# Patient Record
Sex: Male | Born: 1985 | Race: Black or African American | Hispanic: No | Marital: Single | Smoking: Never smoker
Health system: Southern US, Community
[De-identification: ages and names within clinical notes are randomized; demographics above are authoritative.]

## PROBLEM LIST (undated history)

## (undated) DIAGNOSIS — F988 Other specified behavioral and emotional disorders with onset usually occurring in childhood and adolescence: Secondary | ICD-10-CM

---

## 2007-09-10 ENCOUNTER — Emergency Department (HOSPITAL_COMMUNITY): Admission: EM | Admit: 2007-09-10 | Discharge: 2007-09-10 | Payer: Self-pay | Admitting: Emergency Medicine

## 2010-12-09 ENCOUNTER — Emergency Department (INDEPENDENT_AMBULATORY_CARE_PROVIDER_SITE_OTHER)
Admission: EM | Admit: 2010-12-09 | Discharge: 2010-12-09 | Disposition: A | Discharge status: Home / Self Care | Payer: Commercial Managed Care - PPO | Source: Home / Self Care

## 2010-12-09 DIAGNOSIS — J111 Influenza due to unidentified influenza virus with other respiratory manifestations: Secondary | ICD-10-CM

## 2010-12-09 MED ORDER — IBUPROFEN 800 MG PO TABS
800.0000 mg | ORAL_TABLET | Freq: Once | ORAL | Status: AC
  Administered 2010-12-09: 800 mg via ORAL

## 2010-12-09 MED ORDER — IBUPROFEN 800 MG PO TABS
ORAL_TABLET | ORAL | Status: AC
  Filled 2010-12-09: qty 1

## 2010-12-09 NOTE — ED Provider Notes (Signed)
History     CSN: [id number] Arrival date & time: 12/09/2010  6:33 PM   None     Chief Complaint  Patient presents with  . Influenza    Pt has headache, cough, fever and congestion    (Consider location/radiation/quality/duration/timing/severity/associated sxs/prior treatment) HPI Comments: Pt developed sx abruptly on 12/6. Fever, chills, sore throat, nasal congestion, cough, body aches. Did not get flu shot this year  Patient is a 25 y.o. male presenting with flu symptoms.  Influenza This is a new problem. The current episode started more than 2 days ago. The problem occurs constantly. The problem has not changed since onset.Pertinent negatives include no shortness of breath. The symptoms are aggravated by nothing. The symptoms are relieved by nothing. He has tried acetaminophen and rest for the symptoms. The treatment provided mild relief.    Past Medical History  Diagnosis Date  . Asthma     History reviewed. No pertinent past surgical history.  History reviewed. No pertinent family history.  History  Substance Use Topics  . Smoking status: Not on file  . Smokeless tobacco: Not on file  . Alcohol Use:       Review of Systems  Constitutional: Positive for fever and chills.  HENT: Positive for congestion, sore throat and rhinorrhea.   Respiratory: Positive for cough. Negative for shortness of breath and wheezing.   Musculoskeletal: Positive for myalgias.    Allergies  Amoxicillin and Ceclor  Home Medications   Current Outpatient Rx  Name Route Sig Dispense Refill  . ACETAMINOPHEN 325 MG PO TABS Oral Take 650 mg by mouth every 6 (six) hours as needed.      . GUAIFENESIN ER 600 MG PO TB12 Oral Take 1,200 mg by mouth 2 (two) times daily.      . IBUPROFEN 200 MG PO TABS Oral Take 200 mg by mouth every 6 (six) hours as needed.        BP 125/75  Pulse 110  Temp(Src) 100.7 F (38.2 C) (Oral)  Resp 21  SpO2 100%  Physical Exam  Constitutional: He appears  well-developed and well-nourished. He appears ill.  HENT:  Right Ear: Tympanic membrane, external ear and ear canal normal.  Left Ear: Tympanic membrane, external ear and ear canal normal.  Nose: Mucosal edema and rhinorrhea present.  Mouth/Throat: Oropharynx is clear and moist and mucous membranes are normal.  Cardiovascular: Normal rate and regular rhythm.   Pulmonary/Chest: Effort normal and breath sounds normal.    ED Course  Procedures (including critical care time)  Labs Reviewed - No data to display No results found.   1. Influenza     Sx c/w flu  MDM          Cathlyn Parsons, NP 12/09/10 1946

## 2010-12-09 NOTE — ED Provider Notes (Signed)
Medical screening examination/treatment/procedure(s) were performed by non-physician practitioner and as supervising physician I was immediately available for consultation/collaboration.  Raynald Blend, MD 12/09/10 2111

## 2011-03-27 ENCOUNTER — Encounter (HOSPITAL_COMMUNITY): Payer: Self-pay | Admitting: Emergency Medicine

## 2011-03-27 ENCOUNTER — Emergency Department (HOSPITAL_COMMUNITY)
Admission: EM | Admit: 2011-03-27 | Discharge: 2011-03-27 | Disposition: A | Discharge status: Home / Self Care | Payer: Commercial Managed Care - PPO | Attending: Emergency Medicine | Admitting: Emergency Medicine

## 2011-03-27 DIAGNOSIS — X58XXXA Exposure to other specified factors, initial encounter: Secondary | ICD-10-CM | POA: Insufficient documentation

## 2011-03-27 DIAGNOSIS — H53149 Visual discomfort, unspecified: Secondary | ICD-10-CM | POA: Insufficient documentation

## 2011-03-27 DIAGNOSIS — H5710 Ocular pain, unspecified eye: Secondary | ICD-10-CM | POA: Insufficient documentation

## 2011-03-27 DIAGNOSIS — J45909 Unspecified asthma, uncomplicated: Secondary | ICD-10-CM | POA: Insufficient documentation

## 2011-03-27 DIAGNOSIS — S0501XA Injury of conjunctiva and corneal abrasion without foreign body, right eye, initial encounter: Secondary | ICD-10-CM

## 2011-03-27 DIAGNOSIS — S058X9A Other injuries of unspecified eye and orbit, initial encounter: Secondary | ICD-10-CM | POA: Insufficient documentation

## 2011-03-27 MED ORDER — FLUORESCEIN SODIUM 1 MG OP STRP
1.0000 | ORAL_STRIP | Freq: Once | OPHTHALMIC | Status: AC
  Administered 2011-03-27: 1 via OPHTHALMIC
  Filled 2011-03-27: qty 1

## 2011-03-27 MED ORDER — OXYCODONE-ACETAMINOPHEN 5-325 MG PO TABS
1.0000 | ORAL_TABLET | Freq: Once | ORAL | Status: AC
  Administered 2011-03-27: 1 via ORAL
  Filled 2011-03-27: qty 1

## 2011-03-27 MED ORDER — PROPARACAINE HCL 0.5 % OP SOLN
2.0000 [drp] | Freq: Once | OPHTHALMIC | Status: AC
  Administered 2011-03-27: 2 [drp] via OPHTHALMIC
  Filled 2011-03-27: qty 15

## 2011-03-27 MED ORDER — TOBRAMYCIN 0.3 % OP OINT
TOPICAL_OINTMENT | Freq: Once | OPHTHALMIC | Status: AC
  Administered 2011-03-27: via OPHTHALMIC
  Filled 2011-03-27: qty 3.5

## 2011-03-27 MED ORDER — OXYCODONE-ACETAMINOPHEN 5-325 MG PO TABS: 1.0000 | ORAL_TABLET | Freq: Four times a day (QID) | ORAL | Status: AC | PRN

## 2011-03-27 NOTE — ED Provider Notes (Signed)
Medical screening examination/treatment/procedure(s) were performed by non-physician practitioner and as supervising physician I was immediately available for consultation/collaboration.  Ayat Drenning, MD 03/27/11 2357 

## 2011-03-27 NOTE — ED Notes (Signed)
Pt states he was playing tennis and got hit in the right eye with the tennis ball  Pt states when he opens his eye he sees like flashing lights  Pt guarding his right eye

## 2011-03-27 NOTE — Discharge Instructions (Signed)
Corneal Abrasion The cornea is the clear covering at the front and center of the eye. It is a thin tissue made up of layers. The top layer is the most sensitive layer. A corneal abrasion happens if this layer is scratched or an injury causes it to come off.  HOME CARE  You may be given drops or a medicated cream. Use the medicine as told by your doctor.   A pressure patch may be put over the eye. If this is done, follow your doctor's instructions for when to remove the patch. Do not drive or use machines while the eye patch is on. Judging distances is hard to do with a patch on.   See your doctor for a follow-up exam if you are told to do so.  GET HELP RIGHT AWAY IF:   The pain is getting worse or is very bad.   The eye is very sensitive to light.   Any liquid comes out of the injured eye after treatment.   Your vision suddenly gets worse.   You have a sudden loss of vision or blindness.  MAKE SURE YOU:   Understand these instructions.   Will watch your condition.   Will get help right away if you are not doing well or get worse.  Document Released: 06/06/2007 Document Revised: 12/07/2010 Document Reviewed: 06/06/2007 [facility] Patient Information 2012 [place], [place]. Uses applied eye ointment every 4 hours.  Please call Dr. Allyne Gee office first thing in the morning after a.m. to set an evaluation time

## 2011-03-27 NOTE — ED Provider Notes (Signed)
History     CSN: [id number]  Arrival date & time 03/27/11  4010   First MD Initiated Contact with Patient 03/27/11 2108      Chief Complaint  Patient presents with  . Eye Injury    (Consider location/radiation/quality/duration/timing/severity/associated sxs/prior treatment) HPI Comments: Patient states he was playing tennis and hit in the right eye with a tennis ball is now having pain and watering slightly sensitive to light  Patient is a 26 y.o. male presenting with eye injury. The history is provided by the patient.  Eye Injury This is a new problem. The current episode started today. The problem occurs constantly. Pertinent negatives include no headaches. He has tried nothing for the symptoms.    Past Medical History  Diagnosis Date  . Asthma     History reviewed. No pertinent past surgical history.  Family History  Problem Relation Age of Onset  . Hypertension Other     History  Substance Use Topics  . Smoking status: Never Smoker   . Smokeless tobacco: Not on file  . Alcohol Use: Yes     occassional      Review of Systems  Eyes: Positive for photophobia and pain.  Neurological: Negative for dizziness and headaches.    Allergies  Amoxicillin and Ceclor  Home Medications   Current Outpatient Rx  Name Route Sig Dispense Refill  . ALBUTEROL SULFATE HFA 108 (90 BASE) MCG/ACT IN AERS Inhalation Inhale 2 puffs into the lungs every 6 (six) hours as needed. For shortness of breath.    . AMPHETAMINE-DEXTROAMPHETAMINE 10 MG PO TABS Oral Take 10 mg by mouth 2 (two) times daily.    . OXYCODONE-ACETAMINOPHEN 5-325 MG PO TABS Oral Take 1 tablet by mouth every 6 (six) hours as needed for pain. 15 tablet 0    BP 122/77  Pulse 84  Temp(Src) 98.4 F (36.9 C) (Oral)  Resp 18  SpO2 99%  Physical Exam  Constitutional: He is oriented to person, place, and time. He appears well-developed and well-nourished.  HENT:  Head: Normocephalic.  Eyes: EOM are normal.  Right conjunctiva is injected. Right conjunctiva has no hemorrhage. Right eye exhibits no nystagmus. Right pupil is round and reactive.       Abrasion irregular in shape.  The covers most of the pupil and iris to was used to ascertain pressure, which is 12  Neck: Normal range of motion.  Cardiovascular: Normal rate.   Pulmonary/Chest: Effort normal.  Musculoskeletal: Normal range of motion.  Neurological: He is alert and oriented to person, place, and time.  Skin: Skin is warm.    ED Course  Procedures (including critical care time)  Labs Reviewed - No data to display No results found.   1. Corneal abrasion, right       MDM  Corneal Abrasion   I spoke with Dr. Allyne Gee, who agrees with treatment plan by mouth pain control, as well as tobramycin ointment.  He requests that the patient.  Call first thing in the morning to set a time for reevaluation in his office     Arman Filter, NP 03/27/11 2323

## 2012-06-19 ENCOUNTER — Encounter (HOSPITAL_COMMUNITY): Payer: Self-pay | Admitting: Emergency Medicine

## 2012-06-19 ENCOUNTER — Other Ambulatory Visit: Payer: Self-pay

## 2012-06-19 ENCOUNTER — Emergency Department (INDEPENDENT_AMBULATORY_CARE_PROVIDER_SITE_OTHER)
Admission: EM | Admit: 2012-06-19 | Discharge: 2012-06-19 | Disposition: A | Discharge status: Nursing Home (Includes ICF) | Payer: Commercial Managed Care - PPO | Source: Home / Self Care | Attending: Family Medicine | Admitting: Family Medicine

## 2012-06-19 ENCOUNTER — Emergency Department (HOSPITAL_COMMUNITY)
Admission: EM | Admit: 2012-06-19 | Discharge: 2012-06-19 | Disposition: A | Discharge status: Home / Self Care | Payer: Commercial Managed Care - PPO | Attending: Emergency Medicine | Admitting: Emergency Medicine

## 2012-06-19 ENCOUNTER — Emergency Department (HOSPITAL_COMMUNITY): Payer: Commercial Managed Care - PPO

## 2012-06-19 ENCOUNTER — Encounter (HOSPITAL_COMMUNITY): Payer: Self-pay | Admitting: *Deleted

## 2012-06-19 DIAGNOSIS — Z79899 Other long term (current) drug therapy: Secondary | ICD-10-CM | POA: Insufficient documentation

## 2012-06-19 DIAGNOSIS — R51 Headache: Secondary | ICD-10-CM

## 2012-06-19 DIAGNOSIS — R55 Syncope and collapse: Secondary | ICD-10-CM

## 2012-06-19 DIAGNOSIS — Z880 Allergy status to penicillin: Secondary | ICD-10-CM | POA: Insufficient documentation

## 2012-06-19 DIAGNOSIS — Z8659 Personal history of other mental and behavioral disorders: Secondary | ICD-10-CM | POA: Insufficient documentation

## 2012-06-19 DIAGNOSIS — R110 Nausea: Secondary | ICD-10-CM | POA: Insufficient documentation

## 2012-06-19 DIAGNOSIS — J45909 Unspecified asthma, uncomplicated: Secondary | ICD-10-CM | POA: Insufficient documentation

## 2012-06-19 HISTORY — DX: Other specified behavioral and emotional disorders with onset usually occurring in childhood and adolescence: F98.8

## 2012-06-19 LAB — BASIC METABOLIC PANEL
CO2: 33 mEq/L — ABNORMAL HIGH (ref 19–32)
Calcium: 10 mg/dL (ref 8.4–10.5)
Creatinine, Ser: 1 mg/dL (ref 0.50–1.35)
GFR calc Af Amer: >90 mL/min (ref >90)
GFR calc non Af Amer: >90 mL/min (ref >90)
Sodium: 141 mEq/L (ref 135–145)

## 2012-06-19 LAB — CBC
MCH: 30.9 pg (ref 26.0–34.0)
MCHC: 35.3 g/dL (ref 30.0–36.0)
MCV: 87.4 fL (ref 78.0–100.0)
Platelets: 242 10*3/uL (ref 150–400)
RBC: 5.38 MIL/uL (ref 4.22–5.81)
RDW: 12.5 % (ref 11.5–15.5)

## 2012-06-19 LAB — POCT I-STAT TROPONIN I: Troponin i, poc: 0.01 ng/mL (ref 0.00–0.08)

## 2012-06-19 NOTE — ED Provider Notes (Signed)
History     CSN: [id number]  Arrival date & time 06/19/12  1801   First MD Initiated Contact with Patient 06/19/12 1822      Chief Complaint  Patient presents with  . Near Syncope    (Consider location/radiation/quality/duration/timing/severity/associated sxs/prior treatment) HPI  27 yo bm presents today with the above complaint.  States that he passed out in the hallway at his home and was there for about 3 hours.  Denies any palpitations, chest pain, sob.  No previous hx of syncope.  Earlier in the week he had some nausea but no other GI symptoms.  Denies fever, chills, cough.  No recent medcation changes.  Currently feels tired and fatigued.    Past Medical History  Diagnosis Date  . Asthma   . ADD (attention deficit disorder)     History reviewed. No pertinent past surgical history.  Family History  Problem Relation Age of Onset  . Hypertension Other     History  Substance Use Topics  . Smoking status: Never Smoker   . Smokeless tobacco: Not on file  . Alcohol Use: Yes     Comment: occassional      Review of Systems  Allergies  Amoxicillin and Ceclor  Home Medications   Current Outpatient Rx  Name  Route  Sig  Dispense  Refill  . albuterol (PROVENTIL HFA;VENTOLIN HFA) 108 (90 BASE) MCG/ACT inhaler   Inhalation   Inhale 2 puffs into the lungs every 6 (six) hours as needed. For shortness of breath.         . amphetamine-dextroamphetamine (ADDERALL) 10 MG tablet   Oral   Take 10 mg by mouth 2 (two) times daily.           BP 119/74  Pulse 71  Temp(Src) 98 F (36.7 C) (Oral)  Resp 16  SpO2 100%  Physical Exam  ED Course  Procedures (including critical care time)  Labs Reviewed - No data to display No results found.   1. Syncope   2. Headache       MDM   With patients current symptoms and abnormal ekg today, we will transfer him to the ED for further evaluation.  Discussed with patient and his wife and they both voice understanding.         Zonia Kief, PA-C 06/19/12 1858

## 2012-06-19 NOTE — ED Provider Notes (Signed)
History     CSN: [id number]  Arrival date & time 06/19/12  1857   First MD Initiated Contact with Patient 06/19/12 1922      Chief Complaint  Patient presents with  . Loss of Consciousness  . Headache    (Consider location/radiation/quality/duration/timing/severity/associated sxs/prior treatment) HPI Comments: Patient presents to the ER for evaluation of syncope. Patient reports that he got up this morning and was walking to the bathroom to get ready for work, but did not make it to the bathroom. Patient reports that this was at around 6:30 AM. He did, he woke up at 9:30 AM on the floor outside the bathroom. He noticed that he had headache. Headache has progressively improved through the day. He is now experiencing a slight throbbing across the frontal region. He denies neck pain. Patient does not have any chest pain or shortness of breath. Patient was seen in urgent care, had an EKG which was felt to be abnormal and was sent to the ER for further workup.  Patient is a 27 y.o. male presenting with syncope and headaches.  Loss of Consciousness Associated symptoms: headaches and nausea   Associated symptoms: no chest pain, no palpitations and no shortness of breath   Headache Associated symptoms: nausea and syncope   Associated symptoms: no neck pain     Past Medical History  Diagnosis Date  . Asthma   . ADD (attention deficit disorder)     History reviewed. No pertinent past surgical history.  Family History  Problem Relation Age of Onset  . Hypertension Other     History  Substance Use Topics  . Smoking status: Never Smoker   . Smokeless tobacco: Not on file  . Alcohol Use: Yes     Comment: occassional      Review of Systems  HENT: Negative for neck pain.   Respiratory: Negative for shortness of breath.   Cardiovascular: Positive for syncope. Negative for chest pain and palpitations.  Gastrointestinal: Positive for nausea.  Neurological: Positive for headaches.   All other systems reviewed and are negative.    Allergies  Amoxicillin and Ceclor  Home Medications   Current Outpatient Rx  Name  Route  Sig  Dispense  Refill  . albuterol (PROVENTIL HFA;VENTOLIN HFA) 108 (90 BASE) MCG/ACT inhaler   Inhalation   Inhale 2 puffs into the lungs every 6 (six) hours as needed for wheezing or shortness of breath. For shortness of breath.         . loratadine (CLARITIN) 10 MG tablet   Oral   Take 10 mg by mouth daily.           BP 132/78  Pulse 72  Temp(Src) 98.1 F (36.7 C) (Oral)  Resp 18  SpO2 100%  Physical Exam  Constitutional: He is oriented to person, place, and time. He appears well-developed and well-nourished. No distress.  HENT:  Head: Normocephalic and atraumatic.  Right Ear: Hearing normal.  Left Ear: Hearing normal.  Nose: Nose normal.  Mouth/Throat: Oropharynx is clear and moist and mucous membranes are normal.  Eyes: Conjunctivae and EOM are normal. Pupils are equal, round, and reactive to light.  Neck: Normal range of motion. Neck supple.  Cardiovascular: Regular rhythm, S1 normal and S2 normal.  Exam reveals no gallop and no friction rub.   No murmur heard. Pulmonary/Chest: Effort normal and breath sounds normal. No respiratory distress. He exhibits no tenderness.  Abdominal: Soft. Normal appearance and bowel sounds are normal. There is no hepatosplenomegaly.  There is no tenderness. There is no rebound, no guarding, no tenderness at McBurney's point and negative Murphy's sign. No hernia.  Musculoskeletal: Normal range of motion.  Neurological: He is alert and oriented to person, place, and time. He has normal strength. No cranial nerve deficit or sensory deficit. Coordination normal. GCS eye subscore is 4. GCS verbal subscore is 5. GCS motor subscore is 6.  Skin: Skin is warm, dry and intact. No rash noted. No cyanosis.  Psychiatric: He has a normal mood and affect. His speech is normal and behavior is normal. Thought  content normal.    ED Course  Procedures (including critical care time)  EKG:  Date: 06/19/2012  Rate: 74  Rhythm: normal sinus rhythm  QRS Axis: normal  Intervals: normal  ST/T Wave abnormalities: nonspecific T wave changes  Conduction Disutrbances:none  Narrative Interpretation:   Old EKG Reviewed: none available    Labs Reviewed  BASIC METABOLIC PANEL - Abnormal; Notable for the following:    CO2 33 (*)    Glucose, Bld 103 (*)    All other components within normal limits  CBC  POCT I-STAT TROPONIN I   Dg Chest 2 View  06/19/2012   *RADIOLOGY REPORT*  Clinical Data: Loss of consciousness, asthma.  CHEST - 2 VIEW  Comparison: None.  Findings: Lungs clear.  Heart size and pulmonary vascularity normal.  No effusion.  Visualized bones unremarkable.  IMPRESSION: No acute disease   Original Report Authenticated By: D. Andria Rhein, MD   Ct Head Wo Contrast  06/19/2012   *RADIOLOGY REPORT*  Clinical Data: Syncope, headache  CT HEAD WITHOUT CONTRAST  Technique:  Contiguous axial images were obtained from the base of the skull through the vertex without contrast.  Comparison: None.  Findings: No acute intracranial hemorrhage, acute infarction, mass lesion, mass effect, midline shift or hydrocephalus.  Gray-white differentiation is preserved throughout.  The globes are intact. The orbits are symmetric bilaterally.  No focal soft tissue or calvarial abnormality.  Normal aeration of the mastoid air cells and visualized paranasal sinuses.  IMPRESSION: Negative noncontrasted head CT.   Original Report Authenticated By: Malachy Moan, M.D.     Diagnosis: Syncope    MDM  Patient had a syncopal episode this morning. Patient does not recall any specific symptoms prior to the episode. He thinks that he laid on the floor in his hallway for several hours. He had a headache initially, but that is resolving. He did not have a headache before passing out, however. CT head did not show any  evidence of injury and no concerning findings that would explain the syncope.  Patient was seen at urgent care and referred to the ER because he thought his EKG was abnormal. Patient had some nonspecific T wave flattening, otherwise a normal EKG. There is no evidence of ischemia there is no arrhythmia. Patient's workup has been otherwise unremarkable here in the ER. I do not have Significant concern that this was cardiac in etiology. The prolonged time and he laid on the floor raises concern for possible seizure with postictal state, but he did not have any tongue biting, incontinence. At this point, the episode occurred 15 hours ago and I do not see any need to hospitalize him for further management. He is neurologically at his baseline and workup was negative. He'll be discharged, needs to establish primary care doctor for followup. Return to the ER for any recurrent symptoms.     Gilda Crease, MD 06/19/12 2137

## 2012-06-19 NOTE — ED Notes (Signed)
States he was getting for work @ 0630 and passed in the hallway. States he woke up at 0930.  Hx. Febrile seizures. Last seizure age 27.  Denies being a heavy drinker.  C/o headache if he turns his head back and forth.  Headache was worse when he woke up but is mild now.  No other pain or injury.  No chest pain, SOB, nausea or sweating.   Wife states he complained of nausea on Monday night. He called his wife when he woke up this AM.  He went back to sleep @ [phone] and his wife woke him @ 1700.

## 2012-06-19 NOTE — ED Notes (Signed)
Pt c/o syncope today; pt sts getting ready for work at [employer] and then woke up on floor at 0930; pt sts HA today; pt denies CP; pt sent here from [facility]

## 2012-06-19 NOTE — ED Notes (Signed)
Pt to CT at this time.

## 2012-06-20 NOTE — ED Provider Notes (Signed)
Medical screening examination/treatment/procedure(s) were performed by resident physician or non-physician practitioner and as supervising physician I was immediately available for consultation/collaboration.   Barkley Bruns MD.   Linna Hoff, MD 06/20/12 1228

## 2013-06-25 IMAGING — CT CT HEAD W/O CM
1 series · 16 of 30 positions shown, 20 images · non-contrast
Comparison: None.

CLINICAL DATA: Syncope, headache

CT HEAD WITHOUT CONTRAST
TECHNIQUE: Contiguous axial images were obtained from the base of
the skull through the vertex without contrast.

[Series 2: head trauma 4.8 h37s · axial · 0.46mm/px · z∈[-147,+7]mm · 16 of 36 slices shown, 20 images]
[im 2/36  brain]
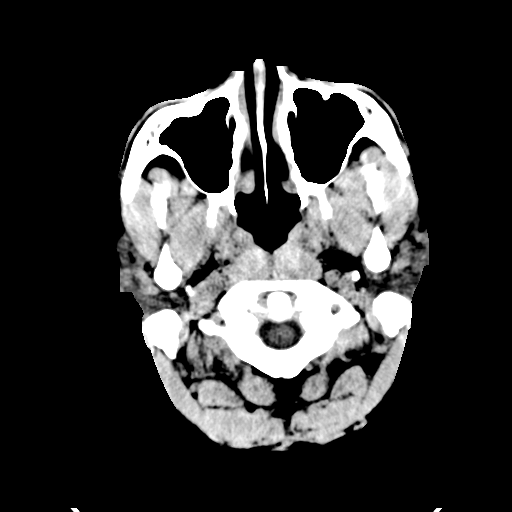
[im 2/36  bone]
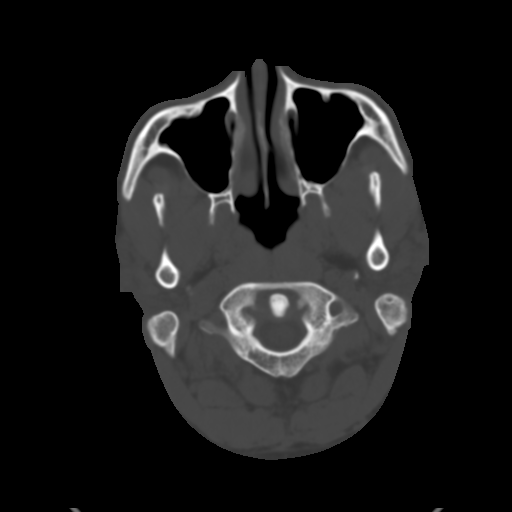
[im 4/36  brain]
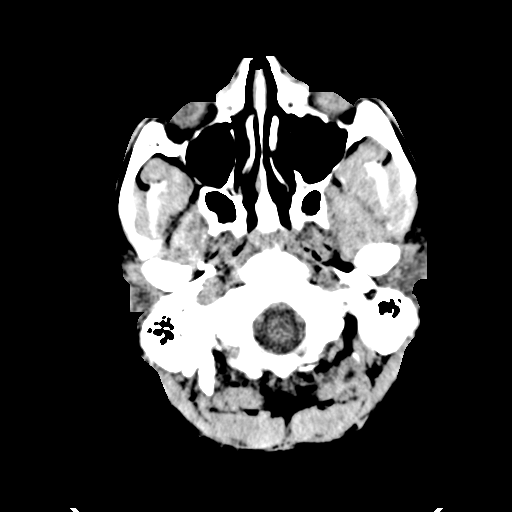
[im 7/36  brain]
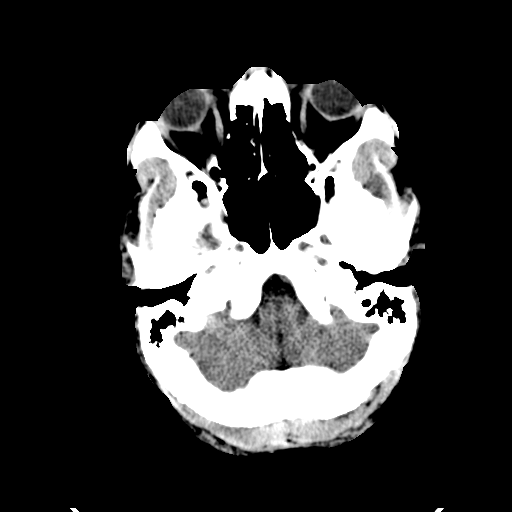
[im 9/36  brain]
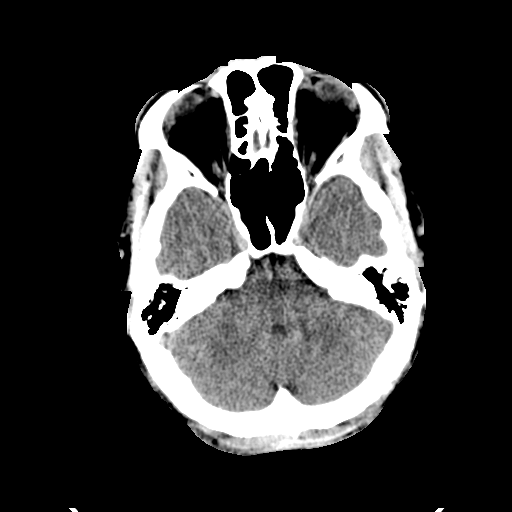
[im 10/36  brain]
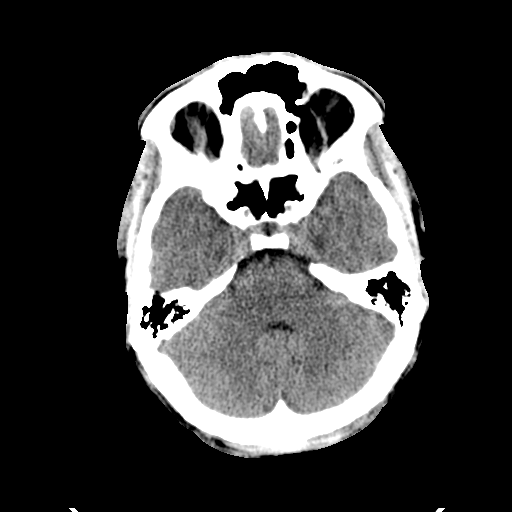
[im 10/36  bone]
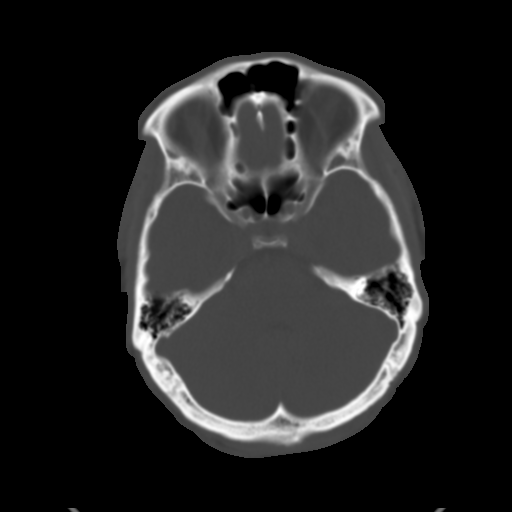
[im 13/36  brain]
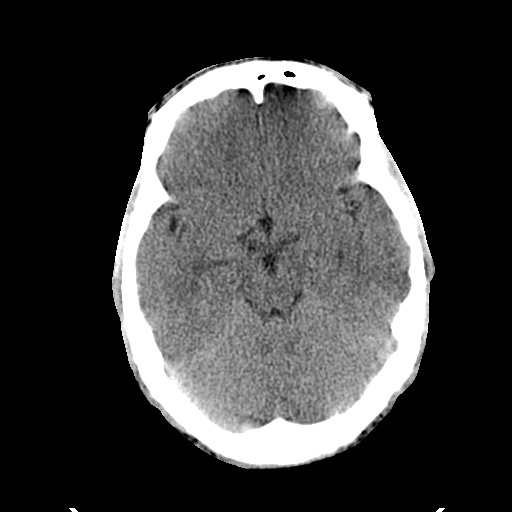
[im 15/36  brain]
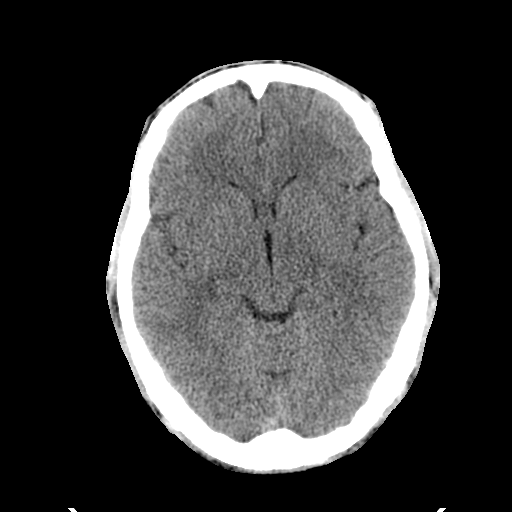
[im 17/36  brain]
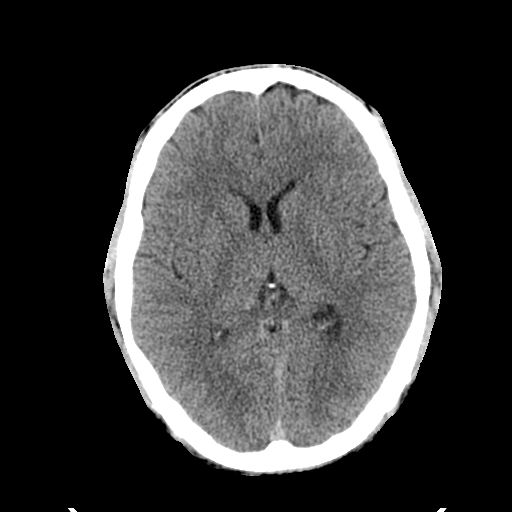
[im 19/36  brain]
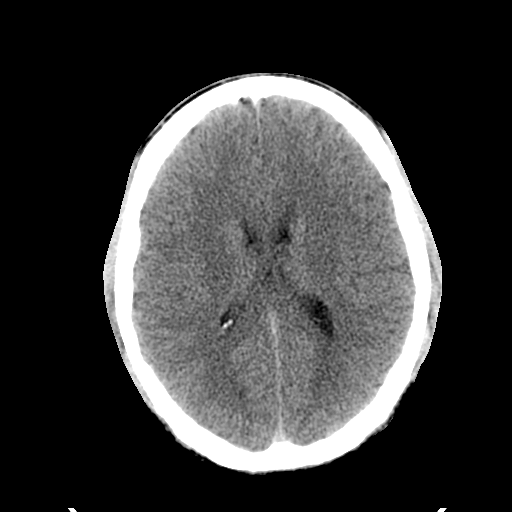
[im 19/36  bone]
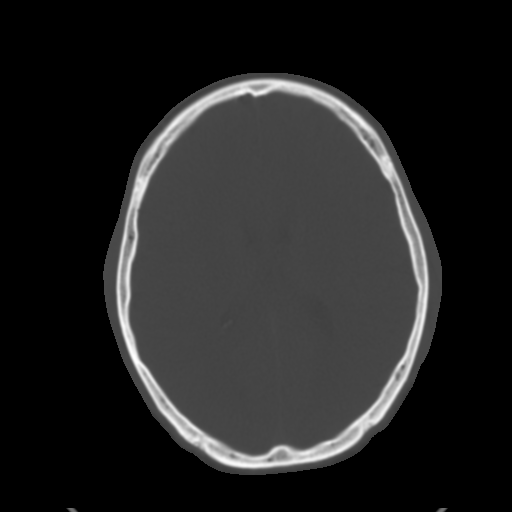
[im 21/36  brain]
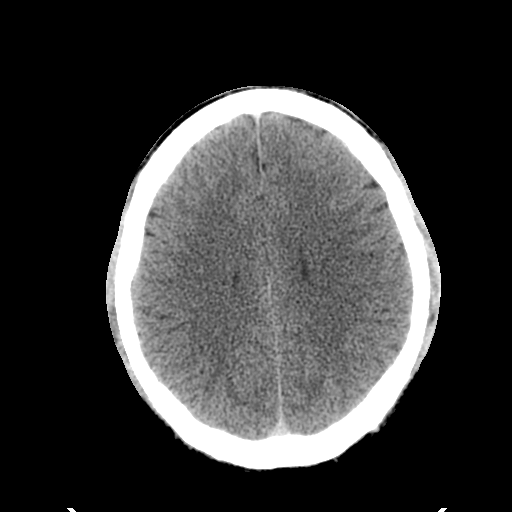
[im 23/36  brain]
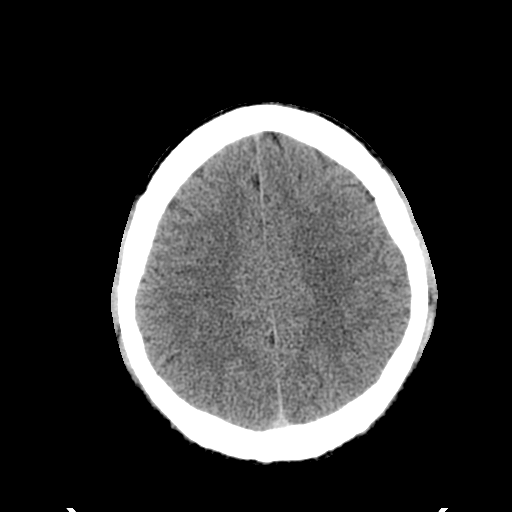
[im 26/36  brain]
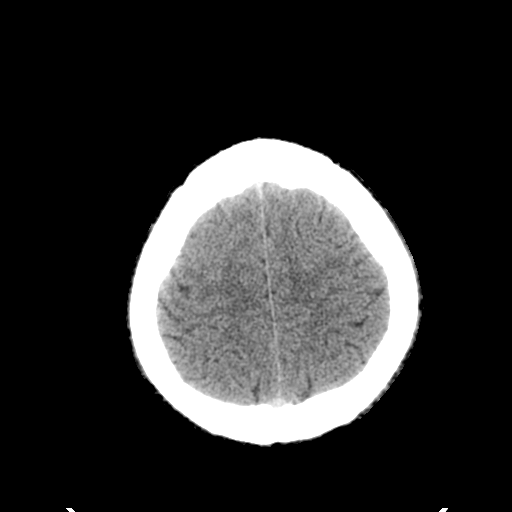
[im 27/36  brain]
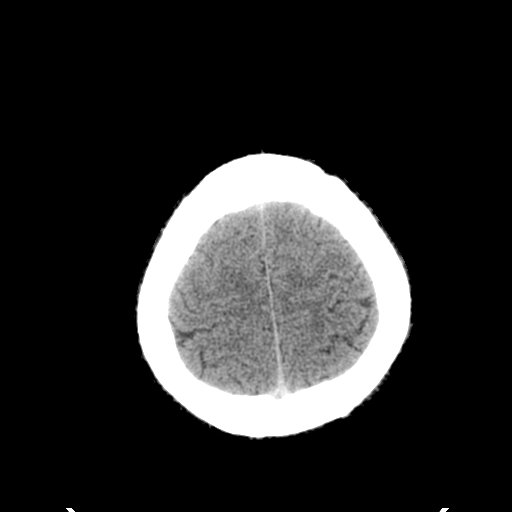
[im 27/36  bone]
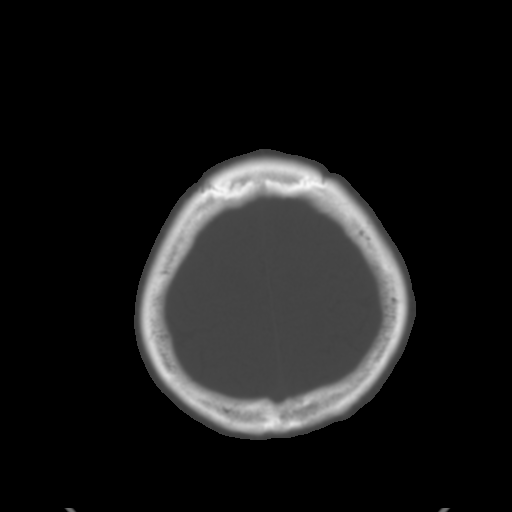
[im 29/36  brain]
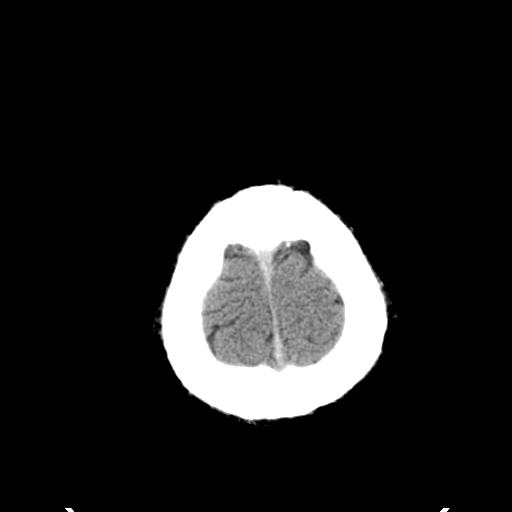
[im 32/36  brain]
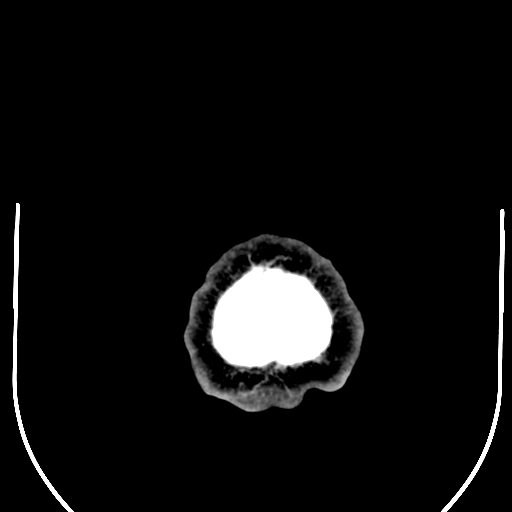
[im 34/36  brain]
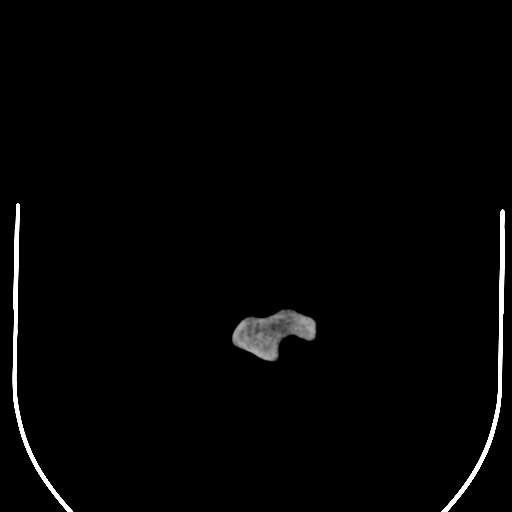

[16 of 30 positions shown; findings below may reference images not displayed]

FINDINGS: No acute intracranial hemorrhage, acute infarction, mass
lesion, mass effect, midline shift or hydrocephalus.  Gray-white
differentiation is preserved throughout.  The globes are intact.
The orbits are symmetric bilaterally.  No focal soft tissue or
calvarial abnormality.  Normal aeration of the mastoid air cells
and visualized paranasal sinuses.
IMPRESSION: Negative noncontrasted head CT.

## 2015-01-26 ENCOUNTER — Emergency Department (HOSPITAL_BASED_OUTPATIENT_CLINIC_OR_DEPARTMENT_OTHER)
Admission: EM | Admit: 2015-01-26 | Discharge: 2015-01-26 | Disposition: A | Discharge status: Home / Self Care | Payer: Commercial Managed Care - PPO | Attending: Emergency Medicine | Admitting: Emergency Medicine

## 2015-01-26 ENCOUNTER — Emergency Department (HOSPITAL_BASED_OUTPATIENT_CLINIC_OR_DEPARTMENT_OTHER): Payer: Commercial Managed Care - PPO

## 2015-01-26 ENCOUNTER — Encounter (HOSPITAL_BASED_OUTPATIENT_CLINIC_OR_DEPARTMENT_OTHER): Payer: Self-pay

## 2015-01-26 DIAGNOSIS — Z8659 Personal history of other mental and behavioral disorders: Secondary | ICD-10-CM | POA: Insufficient documentation

## 2015-01-26 DIAGNOSIS — J45909 Unspecified asthma, uncomplicated: Secondary | ICD-10-CM | POA: Insufficient documentation

## 2015-01-26 DIAGNOSIS — Z79899 Other long term (current) drug therapy: Secondary | ICD-10-CM | POA: Insufficient documentation

## 2015-01-26 DIAGNOSIS — J069 Acute upper respiratory infection, unspecified: Secondary | ICD-10-CM | POA: Insufficient documentation

## 2015-01-26 DIAGNOSIS — Z880 Allergy status to penicillin: Secondary | ICD-10-CM | POA: Insufficient documentation

## 2015-01-26 DIAGNOSIS — H748X3 Other specified disorders of middle ear and mastoid, bilateral: Secondary | ICD-10-CM | POA: Insufficient documentation

## 2015-01-26 MED ORDER — OXYMETAZOLINE HCL 0.05 % NA SOLN
1.0000 | Freq: Once | NASAL | Status: AC
  Administered 2015-01-26: 1 via NASAL
  Filled 2015-01-26: qty 15

## 2015-01-26 MED ORDER — ALBUTEROL SULFATE HFA 108 (90 BASE) MCG/ACT IN AERS
1.0000 | INHALATION_SPRAY | RESPIRATORY_TRACT | Status: DC | PRN
  Administered 2015-01-26: 2 via RESPIRATORY_TRACT
  Filled 2015-01-26: qty 6.7

## 2015-01-26 NOTE — ED Provider Notes (Signed)
CSN: [id number]     Arrival date & time 01/26/15  1345 History   First MD Initiated Contact with Patient 01/26/15 1356     Chief Complaint  Patient presents with  . Nasal Congestion     (Consider location/radiation/quality/duration/timing/severity/associated sxs/prior Treatment) Patient is a 30 y.o. male presenting with URI. The history is provided by the patient.  URI Presenting symptoms: congestion, cough and rhinorrhea   Presenting symptoms: no fever   Severity:  Moderate Onset quality:  Gradual Duration:  2 weeks Timing:  Constant Progression:  Unchanged Chronicity:  New Relieved by:  None tried Exacerbated by: lying down at night. Ineffective treatments: vit c. Associated symptoms: no neck pain, no sinus pain, no swollen glands and no wheezing   Risk factors: sick contacts   Risk factors comment:  Hx of asthma   Past Medical History  Diagnosis Date  . Asthma   . ADD (attention deficit disorder)    History reviewed. No pertinent past surgical history. Family History  Problem Relation Age of Onset  . Hypertension Other    Social History  Substance Use Topics  . Smoking status: Never Smoker   . Smokeless tobacco: None  . Alcohol Use: Yes     Comment: occassional    Review of Systems  Constitutional: Negative for fever.  HENT: Positive for congestion and rhinorrhea.   Respiratory: Positive for cough. Negative for wheezing.   Musculoskeletal: Negative for neck pain.  All other systems reviewed and are negative.     Allergies  Amoxicillin and Ceclor  Home Medications   Prior to Admission medications   Medication Sig Start Date End Date Taking? Authorizing Provider  albuterol (PROVENTIL HFA;VENTOLIN HFA) 108 (90 BASE) MCG/ACT inhaler Inhale 2 puffs into the lungs every 6 (six) hours as needed for wheezing or shortness of breath. For shortness of breath.   Yes Historical Provider, MD  loratadine (CLARITIN) 10 MG tablet Take 10 mg by mouth daily.     Historical Provider, MD   BP 135/92 mmHg  Pulse 99  Temp(Src) 98.3 F (36.8 C) (Oral)  Resp 15  Ht  (1.778 m)  Wt 260 lb (117.935 kg)  BMI 37.31 kg/m2  SpO2 97% Physical Exam  Constitutional: He is oriented to person, place, and time. He appears well-developed and well-nourished. No distress.  HENT:  Head: Normocephalic and atraumatic.  Right Ear: A middle ear effusion is present.  Left Ear: A middle ear effusion is present.  Nose: Mucosal edema present. Right sinus exhibits no maxillary sinus tenderness and no frontal sinus tenderness. Left sinus exhibits no maxillary sinus tenderness and no frontal sinus tenderness.  Mouth/Throat: Oropharynx is clear and moist and mucous membranes are normal.  Eyes: Conjunctivae and EOM are normal. Pupils are equal, round, and reactive to light.  Neck: Normal range of motion. Neck supple.  Cardiovascular: Normal rate, regular rhythm and intact distal pulses.   No murmur heard. Pulmonary/Chest: Effort normal and breath sounds normal. No respiratory distress. He has no wheezes. He has no rales.  Musculoskeletal: Normal range of motion. He exhibits no edema or tenderness.  Lymphadenopathy:    He has no cervical adenopathy.  Neurological: He is alert and oriented to person, place, and time.  Skin: Skin is warm and dry. No rash noted. No erythema.  Psychiatric: He has a normal mood and affect. His behavior is normal.  Nursing note and vitals reviewed.   ED Course  Procedures (including critical care time) Labs Review Labs Reviewed -  No data to display  Imaging Review Dg Chest 2 View  01/26/2015  CLINICAL DATA:  Cough for the past 2 weeks. EXAM: CHEST  2 VIEW COMPARISON:  06/19/2012. FINDINGS: Normal sized heart. Clear lungs. Stable mild central peribronchial thickening. Normal appearing bones. IMPRESSION: Mild bronchitic changes, unchanged. Electronically Signed   By: Beckie Salts M.D.   On: 01/26/2015 14:06   I have personally reviewed  and evaluated these images and lab results as part of my medical decision-making.   EKG Interpretation None      MDM   Final diagnoses:  URI (upper respiratory infection)    Pt with symptoms consistent with viral URI with persistent cough and hx of asthma but no wheezing on exam and no c/o of SOB.  Well appearing here.  No signs of breathing difficulty  No signs of pharyngitis, otitis or abnormal abdominal findings.   CXR wnl and pt to return with any further problems.     Gwyneth Sprout, MD 01/26/15 1415

## 2015-01-26 NOTE — ED Notes (Signed)
Pa  at bedside. 

## 2015-01-26 NOTE — Discharge Instructions (Signed)
Upper Respiratory Infection, Adult Most upper respiratory infections (URIs) are a viral infection of the air passages leading to the lungs. A URI affects the nose, throat, and upper air passages. The most common type of URI is nasopharyngitis and is typically referred to as "the common cold." URIs run their course and usually go away on their own. Most of the time, a URI does not require medical attention, but sometimes a bacterial infection in the upper airways can follow a viral infection. This is called a secondary infection. Sinus and middle ear infections are common types of secondary upper respiratory infections. Bacterial pneumonia can also complicate a URI. A URI can worsen asthma and chronic obstructive pulmonary disease (COPD). Sometimes, these complications can require emergency medical care and may be life threatening.  CAUSES Almost all URIs are caused by viruses. A virus is a type of germ and can spread from one person to another.  RISKS FACTORS You may be at risk for a URI if:   You smoke.   You have chronic heart or lung disease.  You have a weakened defense (immune) system.   You are very young or very old.   You have nasal allergies or asthma.  You work in crowded or poorly ventilated areas.  You work in health care facilities or schools. SIGNS AND SYMPTOMS  Symptoms typically develop 2-3 days after you come in contact with a cold virus. Most viral URIs last 7-10 days. However, viral URIs from the influenza virus (flu virus) can last 14-18 days and are typically more severe. Symptoms may include:   Runny or stuffy (congested) nose.   Sneezing.   Cough.   Sore throat.   Headache.   Fatigue.   Fever.   Loss of appetite.   Pain in your forehead, behind your eyes, and over your cheekbones (sinus pain).  Muscle aches.  DIAGNOSIS  Your health care provider may diagnose a URI by:  Physical exam.  Tests to check that your symptoms are not due to  another condition such as:  Strep throat.  Sinusitis.  Pneumonia.  Asthma. TREATMENT  A URI goes away on its own with time. It cannot be cured with medicines, but medicines may be prescribed or recommended to relieve symptoms. Medicines may help:  Reduce your fever.  Reduce your cough.  Relieve nasal congestion. HOME CARE INSTRUCTIONS   Take medicines only as directed by your health care provider.   Gargle warm saltwater or take cough drops to comfort your throat as directed by your health care provider.  Use a warm mist humidifier or inhale steam from a shower to increase air moisture. This may make it easier to breathe.  Drink enough fluid to keep your urine clear or pale yellow.   Eat soups and other clear broths and maintain good nutrition.   Rest as needed.   Return to work when your temperature has returned to normal or as your health care provider advises. You may need to stay home longer to avoid infecting others. You can also use a face mask and careful hand washing to prevent spread of the virus.  Increase the usage of your inhaler if you have asthma.   Do not use any tobacco products, including cigarettes, chewing tobacco, or electronic cigarettes. If you need help quitting, ask your health care provider. PREVENTION  The best way to protect yourself from getting a cold is to practice good hygiene.   Avoid oral or hand contact with people with cold   symptoms.   Wash your hands often if contact occurs.  There is no clear evidence that vitamin C, vitamin E, echinacea, or exercise reduces the chance of developing a cold. However, it is always recommended to get plenty of rest, exercise, and practice good nutrition.  SEEK MEDICAL CARE IF:   You are getting worse rather than better.   Your symptoms are not controlled by medicine.   You have chills.  You have worsening shortness of breath.  You have brown or red mucus.  You have yellow or brown nasal  discharge.  You have pain in your face, especially when you bend forward.  You have a fever.  You have swollen neck glands.  You have pain while swallowing.  You have white areas in the back of your throat. SEEK IMMEDIATE MEDICAL CARE IF:   You have severe or persistent:  Headache.  Ear pain.  Sinus pain.  Chest pain.  You have chronic lung disease and any of the following:  Wheezing.  Prolonged cough.  Coughing up blood.  A change in your usual mucus.  You have a stiff neck.  You have changes in your:  Vision.  Hearing.  Thinking.  Mood. MAKE SURE YOU:   Understand these instructions.  Will watch your condition.  Will get help right away if you are not doing well or get worse.   This information is not intended to replace advice given to you by your health care provider. Make sure you discuss any questions you have with your health care provider.   Document Released: 06/13/2000 Document Revised: 05/04/2014 Document Reviewed: 03/25/2013 Elsevier Interactive Patient Education 2016 Elsevier Inc.  

## 2015-01-26 NOTE — ED Notes (Signed)
Patient transported to X-ray 

## 2015-01-26 NOTE — ED Notes (Addendum)
Pt reports nasal congestion x2 weeks. Pt also has headache, prod cough with green-clear thick sputum.

## 2018-06-03 ENCOUNTER — Ambulatory Visit
Admission: EM | Admit: 2018-06-03 | Discharge: 2018-06-03 | Disposition: A | Discharge status: Home / Self Care | Payer: Self-pay

## 2018-06-03 ENCOUNTER — Other Ambulatory Visit: Payer: Self-pay

## 2018-06-03 DIAGNOSIS — M7522 Bicipital tendinitis, left shoulder: Secondary | ICD-10-CM

## 2018-06-03 DIAGNOSIS — B359 Dermatophytosis, unspecified: Secondary | ICD-10-CM

## 2018-06-03 NOTE — ED Triage Notes (Signed)
Pt c/o lt shoulder pain on movement since yesterday, denies injury

## 2018-06-03 NOTE — Discharge Instructions (Addendum)
Ibuprofen 800mg  three times a day for 7-10 days. Ice compress after work. This may take a few weeks to completely resolve, but should be feeling better each week. Follow up with PCP if symptoms not improving.   As discussed, rash looks like ring worm. Can pick over the counter clotrimazole (lotrimin) as directed for 2-3 weeks until resolution. If you start noticing similar rash in your scalp, may need different treatment.

## 2018-06-03 NOTE — ED Provider Notes (Signed)
[facility]    CSN: [id number] Arrival date & time: 06/03/18  1341     History   Chief Complaint Chief Complaint  Patient presents with  . Shoulder Pain    HPI Austin Foley is a 33 y.o. male.   33 year old male comes in for multiple complaints.  Left shoulder pain for the past 2 days. Works at [employer] with heavy lifting. Denies injury/trauma. No pain at rest, pain with ROM, mostly when external rotating shoulder. Denies radiation of pain. Denies numbness/tingling, loss of grip strength. Denies swelling, erythema, warmth. Took advil 400mg   With good relief.  Rash to the back of the neck for a few days. Itching in sensation. Denies spreading redness, increased warmth, fever. States thought it was an insect bite, but itching hasn't resolved and would like to get evaluated. Has not tried anything for the symptoms. No other rashes.      Past Medical History:  Diagnosis Date  . ADD (attention deficit disorder)   . Asthma     There are no active problems to display for this patient.   History reviewed. No pertinent surgical history.     Home Medications    Prior to Admission medications   Medication Sig Start Date End Date Taking? Authorizing Provider  albuterol (PROVENTIL HFA;VENTOLIN HFA) 108 (90 BASE) MCG/ACT inhaler Inhale 2 puffs into the lungs every 6 (six) hours as needed for wheezing or shortness of breath. For shortness of breath.    [provider]  loratadine (CLARITIN) 10 MG tablet Take 10 mg by mouth daily.    [provider]    Family History Family History  Problem Relation Age of Onset  . Hypertension Other     Social History Social History   Tobacco Use  . Smoking status: Never Smoker  . Smokeless tobacco: Never Used  Substance Use Topics  . Alcohol use: Yes    Comment: occassional  . Drug use: No     Allergies   Amoxicillin and Ceclor [cefaclor]   Review of Systems Review of Systems  Reason  unable to perform ROS: See HPI as above.     Physical Exam Triage Vital Signs ED Triage Vitals  Enc Vitals Group     BP 06/03/18 1351 133/85     Pulse Rate 06/03/18 1351 89     Resp 06/03/18 1351 16     Temp 06/03/18 1351 98.1 F (36.7 C)     Temp Source 06/03/18 1351 Oral     SpO2 06/03/18 1351 96 %     Weight --      Height --      Head Circumference --      Peak Flow --      Pain Score 06/03/18 1352 0     Pain Loc --      Pain Edu? --      Excl. in [place]? --    No data found.  Updated Vital Signs BP 133/85 (BP Location: Right Arm)   Pulse 89   Temp 98.1 F (36.7 C) (Oral)   Resp 16   SpO2 96%   Physical Exam Constitutional:      General: He is not in acute distress.    Appearance: He is well-developed. He is not diaphoretic.  HENT:     Head: Normocephalic and atraumatic.  Eyes:     Conjunctiva/sclera: Conjunctivae normal.     Pupils: Pupils are equal, round, and reactive to light.  Musculoskeletal:  Comments: No swelling, erythema, warmth, contusion seen. Tenderness to palpation of left anterior shoulder, along bicep tendon attachment. Full ROM of shoulder. Strength normal and equal bilaterally. Sensation intact and equal bilaterally. Radial pulse 2+, cap refill <2s.  Skin:    Comments: Isolated circular rash with raised border and central clearing to the right posterior neck. Adjacent to hairline. No rashes seen in the scalp.   Neurological:     Mental Status: He is alert and oriented to person, place, and time.    UC Treatments / Results  Labs (all labs ordered are listed, but only abnormal results are displayed) Labs Reviewed - No data to display  EKG None  Radiology No results found.  Procedures Procedures (including critical care time)  Medications Ordered in UC Medications - No data to display  Initial Impression / Assessment and Plan / UC Course  I have reviewed the triage vital signs and the nursing notes.  Pertinent labs & imaging  results that were available during my care of the patient were reviewed by me and considered in my medical decision making (see chart for details).    History and exam most consistent with tendinitis.  Patient would like to continue ibuprofen versus prescription of NSAIDs.  I have patient do ice compress after work as well.  Return precautions given.  Rash appearance most consistent with ringworm.  Rash is close to hairline, but without similar rash in the scalp, will defer treatment for tinea capitis at this time and use clotrimazole cream to affected area.  Patient states will take over-the-counter.  Return precautions given.  Patient expresses understanding and agrees to plan.  Final Clinical Impressions(s) / UC Diagnoses   Final diagnoses:  Biceps tendinitis of left shoulder  Tinea    ED Prescriptions    None        Belinda FisherYu, Isys Tietje V, PA-C 06/03/18 1457

## 2018-10-11 ENCOUNTER — Other Ambulatory Visit: Payer: Self-pay

## 2018-10-11 ENCOUNTER — Ambulatory Visit
Admission: EM | Admit: 2018-10-11 | Discharge: 2018-10-11 | Disposition: A | Discharge status: Home / Self Care | Payer: Self-pay | Attending: Emergency Medicine | Admitting: Emergency Medicine

## 2018-10-11 DIAGNOSIS — L309 Dermatitis, unspecified: Secondary | ICD-10-CM

## 2018-10-11 MED ORDER — CLOTRIMAZOLE-BETAMETHASONE 1-0.05 % EX CREA: TOPICAL_CREAM | CUTANEOUS | 0 refills | Status: AC

## 2018-10-11 NOTE — ED Triage Notes (Signed)
Pt presents to UC w/ c/o small bumps on back of head x few months. Pt states bumps have grown slightly in past few months. Pt denies drainage from bumps. Pt states he had ringworm a few months ago, which was treated with an ointment. He noticed the bumps after the ringworm was treated.

## 2018-10-11 NOTE — Discharge Instructions (Signed)
Apply cream twice daily for 1 week. May get discontinue when itching, scaliness goes away. Return for worsening rash, warmth, pus, fever.  Important to schedule primary care appointment for routine care.

## 2018-10-11 NOTE — ED Provider Notes (Signed)
[facility]    CSN: [id number] Arrival date & time: 10/11/18  0844      History   Chief Complaint No chief complaint on file.   HPI Austin Foley is a 33 y.o. male presenting for small, pruritic bumps on the back of his head for the last few months.  Patient states that he was treated for ringworm prior to onset of symptoms.  Patient denies visible lice, bug bite, fever, spreading of rash, pain.  Has not tried anything for this.   Past Medical History:  Diagnosis Date  . ADD (attention deficit disorder)   . Asthma     There are no active problems to display for this patient.   History reviewed. No pertinent surgical history.     Home Medications    Prior to Admission medications   Medication Sig Start Date End Date Taking? Authorizing Provider  albuterol (PROVENTIL HFA;VENTOLIN HFA) 108 (90 BASE) MCG/ACT inhaler Inhale 2 puffs into the lungs every 6 (six) hours as needed for wheezing or shortness of breath. For shortness of breath.    [provider]  clotrimazole-betamethasone (LOTRISONE) cream Apply to affected area 2 times daily prn 10/11/18   Hall-Potvin, [place], PA-C  loratadine (CLARITIN) 10 MG tablet Take 10 mg by mouth daily.  10/11/18  [provider]    Family History Family History  Problem Relation Age of Onset  . Hypertension Other     Social History Social History   Tobacco Use  . Smoking status: Never Smoker  . Smokeless tobacco: Never Used  Substance Use Topics  . Alcohol use: Yes    Comment: occassional  . Drug use: No     Allergies   Amoxicillin and Ceclor [cefaclor]   Review of Systems Review of Systems  Constitutional: Negative for fatigue and fever.  Respiratory: Negative for cough and shortness of breath.   Cardiovascular: Negative for chest pain and palpitations.  Gastrointestinal: Negative for abdominal pain, diarrhea and vomiting.  Musculoskeletal: Negative for arthralgias and myalgias.   Skin: Positive for rash. Negative for wound.  Neurological: Negative for speech difficulty and headaches.  All other systems reviewed and are negative.    Physical Exam Triage Vital Signs ED Triage Vitals [10/11/18 0851]  Enc Vitals Group     BP 132/85     Pulse Rate 76     Resp 16     Temp 98 F (36.7 C)     Temp Source Oral     SpO2 99 %     Weight      Height      Head Circumference      Peak Flow      Pain Score      Pain Loc      Pain Edu?      Excl. in [place]?    No data found.  Updated Vital Signs BP 132/85 (BP Location: Left Arm)   Pulse 76   Temp 98 F (36.7 C) (Oral)   Resp 16   SpO2 99%   Visual Acuity Right Eye Distance:   Left Eye Distance:   Bilateral Distance:    Right Eye Near:   Left Eye Near:    Bilateral Near:     Physical Exam Constitutional:      General: He is not in acute distress. HENT:     Head: Normocephalic and atraumatic.  Eyes:     General: No scleral icterus.    Pupils: Pupils are equal, round, and  reactive to light.  Cardiovascular:     Rate and Rhythm: Normal rate.  Pulmonary:     Effort: Pulmonary effort is normal.  Skin:    Coloration: Skin is not jaundiced or pale.     Comments: Few, scattered areas of induration with overlying dry, erythematous lesions with scant scaling peeling.    Neurological:     Mental Status: He is alert and oriented to person, place, and time.      UC Treatments / Results  Labs (all labs ordered are listed, but only abnormal results are displayed) Labs Reviewed - No data to display  EKG   Radiology No results found.  Procedures Procedures (including critical care time)  Medications Ordered in UC Medications - No data to display  Initial Impression / Assessment and Plan / UC Course  I have reviewed the triage vital signs and the nursing notes.  Pertinent labs & imaging results that were available during my care of the patient were reviewed by me and considered in my medical  decision making (see chart for details).     1.  Dermatitis Exam findings suggestive of folliculitis versus dermatitis versus tinea.  Will treat with Lotrisone to cover for both.  Return precautions discussed, patient verbalized understanding and is agreeable to plan. Final Clinical Impressions(s) / UC Diagnoses   Final diagnoses:  Dermatitis     Discharge Instructions     Apply cream twice daily for 1 week. May get discontinue when itching, scaliness goes away. Return for worsening rash, warmth, pus, fever.  Important to schedule primary care appointment for routine care.    ED Prescriptions    Medication Sig Dispense Auth. Provider   clotrimazole-betamethasone (LOTRISONE) cream Apply to affected area 2 times daily prn 15 g Hall-Potvin, [place], PA-C     PDMP not reviewed this encounter.   Hall-Potvin, [place], PA-C 10/11/18 1022

## 2019-09-11 ENCOUNTER — Ambulatory Visit: Payer: 59 | Admitting: Physical Therapy

## 2019-09-16 ENCOUNTER — Ambulatory Visit: Payer: 59 | Admitting: Physical Therapy

## 2019-09-17 ENCOUNTER — Encounter: Payer: Self-pay | Admitting: Physical Therapy

## 2019-09-17 ENCOUNTER — Other Ambulatory Visit: Payer: Self-pay

## 2019-09-17 ENCOUNTER — Ambulatory Visit: Payer: 59 | Attending: Family Medicine | Admitting: Physical Therapy

## 2019-09-17 DIAGNOSIS — M6281 Muscle weakness (generalized): Secondary | ICD-10-CM | POA: Insufficient documentation

## 2019-09-17 DIAGNOSIS — M25522 Pain in left elbow: Secondary | ICD-10-CM | POA: Insufficient documentation

## 2019-09-17 NOTE — Patient Instructions (Signed)
Access Code: [email or web address] URL: [email or web address] Date: 09/17/2019 Prepared by: Rosana Hoes  Exercises Standing Bicep Curls Supinated with Dumbbells - 3 x weekly - 2-3 sets - 8 reps Standing Bicep Curls Neutral with Dumbbells - 3 x weekly - 2-3 sets - 8 reps Standing Pronated Elbow Flexion with Dumbbell - 3 x weekly - 2-3 sets - 8 reps Standing Shoulder Flexion to 90 Degrees with Dumbbells - 3 x weekly - 2-3 sets - 8 reps

## 2019-09-17 NOTE — Therapy (Signed)
[facility] [address] [place], [place], [address] Phone: [phone]   Fax:  [phone]  Physical Therapy Evaluation  Patient Details  Name: Austin Foley MRN: [id number] Date of Birth: [birth year] Referring Provider (PT): Stevphen Rochester, MD   Encounter Date: 09/17/2019   PT End of Session - 09/17/19 1225    Visit Number 1    Number of Visits 4    Date for PT Re-Evaluation 11/12/19    Authorization Type [facility]    PT Start Time 1216    PT Stop Time 1300    PT Time Calculation (min) 44 min    Activity Tolerance Patient tolerated treatment well    Behavior During Therapy [facility] for tasks assessed/performed           Past Medical History:  Diagnosis Date  . ADD (attention deficit disorder)   . Asthma     History reviewed. No pertinent surgical history.  There were no vitals filed for this visit.    Subjective Assessment - 09/17/19 1215    Subjective Patient notes a few years back he inured his left arm/elbow while pushing a pallet at work and it felt like his left elbow bent back. He wasn't able to use his arm for over a weak due to pain, but after about 2 weeks the majority of pain went away. He feels his arm healed mostly but he still has sharp when he uses the arm with heavy tasks or bends the elbow like when he picks up his daughter or bowls. Sharp pain is located on the front and just above the elbow.    Limitations House hold activities    How long can you sit comfortably? No limitation    How long can you stand comfortably? No limitation    How long can you walk comfortably? No limitation    Diagnostic tests None performed    Patient Stated Goals Get rid of elbow pain    Currently in Pain? Yes    Pain Score 0-No pain   gets worse with activity   Pain Location Elbow    Pain Orientation Left;Anterior   distal bicep   Pain Descriptors / Indicators Sharp    Pain Type Chronic pain    Pain Onset More than  a month ago    Pain Frequency Intermittent    Aggravating Factors  Bending the elbow while picking something up, bowling    Pain Relieving Factors Rest    Effect of Pain on Daily Activities Patient limited with hobbies and lifting              [facility] PT Assessment - 09/17/19 0001      Assessment   Medical Diagnosis Pain in left arm    Referring Provider (PT) Stevphen Rochester, MD    Onset Date/Surgical Date --   2 and a half years ago   Hand Dominance Right    Next MD Visit Not scheduled    Prior Therapy None      Precautions   Precautions None      Restrictions   Weight Bearing Restrictions No      Balance Screen   Has the patient fallen in the past 6 months No    Has the patient had a decrease in activity level because of a fear of falling?  No    Is the patient reluctant to leave their home because of a fear of falling?  No  Prior Function   Level of Independence Independent    Vocation Full time employment    Vocation Requirements Not doing much lifting    Leisure Bowling      Cognition   Overall Cognitive Status Within Functional Limits for tasks assessed      Observation/Other Assessments   Observations Patient appears in no apparent distress    Focus on Therapeutic Outcomes (FOTO)  36% limitation      Sensation   Light Touch Appears Intact      Coordination   Gross Motor Movements are Fluid and Coordinated Yes      ROM / Strength   AROM / PROM / Strength AROM;Strength      AROM   Overall AROM Comments Patient exhibits shoulder, elbow, wrist AROM WFL and non-painful, no pain reported with overpressure      Strength   Strength Assessment Site Elbow    Right/Left Elbow Right;Left    Right Elbow Flexion 5/5    Right Elbow Extension 5/5    Left Elbow Flexion 4+/5   mild increase in distal bicep pain   Left Elbow Extension 5/5      Palpation   Palpation comment TTP to distal bicep region      Transfers   Transfers Independent with all  Transfers                      Objective measurements completed on examination: See above findings.       [facility] Adult PT Treatment/Exercise - 09/17/19 0001      Exercises   Exercises Elbow      Elbow Exercises   Elbow Flexion Limitations Bicep curl in supinated, neutral, pronated position with 15# x 8 each    Other elbow exercises Shoulder flexion 10# x8      Manual Therapy   Manual Therapy Soft tissue mobilization    Soft tissue mobilization IASTM to distal bicep region                  PT Education - 09/17/19 1214    Education Details Exam findings, POC, HEP    Person(s) Educated Patient    Methods Explanation;Demonstration;Tactile cues;Verbal cues;Handout    Comprehension Verbalized understanding;Returned demonstration;Verbal cues required;Tactile cues required;Need further instruction            PT Short Term Goals - 09/17/19 1225      PT SHORT TERM GOAL #1   Title Patient will be I with initial HEP to progress with PT    Time 4    Period Weeks    Status New    Target Date 10/15/19             PT Long Term Goals - 09/17/19 1319      PT LONG TERM GOAL #1   Title Patient will be I with final HEP to maintain progress from PT    Time 8    Period Weeks    Status New    Target Date 11/12/19      PT LONG TERM GOAL #2   Title Patient will report no limitation or pain with bowling    Time 8    Period Weeks    Status New    Target Date 11/12/19      PT LONG TERM GOAL #3   Title Patient will exhibit 5/5 strength without pain to be able to lift child without limitation    Time 8    Period  Weeks    Status New    Target Date 11/12/19      PT LONG TERM GOAL #4   Title Patient will report improved functional level to </= 26% on FOTO    Time 8    Period Weeks    Status New    Target Date 11/12/19                  Plan - 09/17/19 1306    Clinical Impression Statement Patien presents to PT with report of chronic left  elbow/distal bicep pain with heavy lifting or quick movements such as with bowling. His symptoms seem consistent with pain related to previous distal bicep strain that was never properly rehabed. He exhibited good strength but with mild discomfort, full AROM, distal bicep with palpable tender areas likely scar tissue. Used IASTM to distal bicep and provided patient with progressive resistance program for bicep. He would benefit from continued skilled PT to improve tissue quality, strength, and tolerance for heavy activity of the bicep so he can return to activities such as bowling without limitation.    Personal Factors and Comorbidities Time since onset of injury/illness/exacerbation    Examination-Activity Limitations Lift    Examination-Participation Restrictions Community Activity    Stability/Clinical Decision Making Stable/Uncomplicated    Clinical Decision Making Low    Rehab Potential Good    PT Frequency Biweekly    PT Duration 8 weeks    PT Treatment/Interventions ADLs/Self Care Home Management;Cryotherapy;Electrical Stimulation;Iontophoresis 4mg /ml Dexamethasone;Moist Heat;Therapeutic activities;Therapeutic exercise;Manual techniques;Patient/family education;Passive range of motion;Dry needling;Taping    PT Next Visit Plan Review HEP and progress PRN, manual to distal bicep, progress strengthening, increase speed of movements    PT Home Exercise Plan : bicep curl in supinated, neutral, and pronated position; shoulder flexion    Consulted and Agree with Plan of Care Patient           Patient will benefit from skilled therapeutic intervention in order to improve the following deficits and impairments:  Decreased strength, Decreased activity tolerance, Pain  Visit Diagnosis: Pain in left elbow  Muscle weakness (generalized)     Problem List There are no problems to display for this patient.   [email or web address], PT, DPT, LAT, ATC 09/17/19  1:34 PM Phone:  [phone] Fax: [phone]   [facility] [address] [place], [place], [place] Phone: [phone]   Fax:  [phone]  Name: Barry Faircloth MRN: Vernell Morgans Date of Birth: 12/23/1985

## 2019-10-06 ENCOUNTER — Telehealth: Payer: Self-pay | Admitting: Physical Therapy

## 2019-10-06 ENCOUNTER — Ambulatory Visit: Payer: 59 | Attending: Family Medicine | Admitting: Physical Therapy

## 2019-10-06 DIAGNOSIS — M25522 Pain in left elbow: Secondary | ICD-10-CM | POA: Insufficient documentation

## 2019-10-06 DIAGNOSIS — M6281 Muscle weakness (generalized): Secondary | ICD-10-CM | POA: Insufficient documentation

## 2019-10-06 NOTE — Telephone Encounter (Signed)
Contacted patient due to missed PT appointment. Patient stated he forgot about the appointment. He was reminded of next scheduled appointment on 10/20/2019 and he elected to keep that appointment and not schedule any new ones. He was informed of attendance policy and patient expressed understanding.   Rosana Hoes, PT, DPT, LAT, ATC 10/06/19  1:57 PM Phone: [phone] Fax: [phone]

## 2019-10-20 ENCOUNTER — Other Ambulatory Visit: Payer: Self-pay

## 2019-10-20 ENCOUNTER — Ambulatory Visit: Payer: 59 | Admitting: Physical Therapy

## 2019-10-20 ENCOUNTER — Encounter: Payer: Self-pay | Admitting: Physical Therapy

## 2019-10-20 DIAGNOSIS — M25522 Pain in left elbow: Secondary | ICD-10-CM | POA: Diagnosis not present

## 2019-10-20 DIAGNOSIS — M6281 Muscle weakness (generalized): Secondary | ICD-10-CM

## 2019-10-20 NOTE — Therapy (Addendum)
[place], [place], [address] Phone: [phone]   Fax:  [phone]  Physical Therapy Treatment / Discharge  Patient Details  Name: Jadavion Spoelstra MRN: [id number] Date of Birth: 02/20/1985 Referring Provider (PT): Jolinda Croak, MD   Encounter Date: 10/20/2019   PT End of Session - 10/20/19 1218    Visit Number 2    Number of Visits 4    Date for PT Re-Evaluation 11/12/19    Authorization Type [employer]    PT Start Time 1217    PT Stop Time 1258    PT Time Calculation (min) 41 min    Activity Tolerance Patient tolerated treatment well    Behavior During Therapy [facility] for tasks assessed/performed           Past Medical History:  Diagnosis Date  . ADD (attention deficit disorder)   . Asthma     History reviewed. No pertinent surgical history.  There were no vitals filed for this visit.   Subjective Assessment - 10/20/19 1219    Subjective Patient reports he is doing well, no new issues. He has been to the gym a few times.    Patient Stated Goals Get rid of elbow pain    Currently in Pain? No/denies                             [facility] Adult PT Treatment/Exercise - 10/20/19 0001      Exercises   Exercises Elbow   UBE L3 x 4 min     Elbow Exercises   Elbow Flexion Limitations Bicep curl with upper arm supported 10# x 10, 15# x 10, 25# x 10   5 sec ecc/con   Other elbow exercises Bicep curl at neutral, at 90 deg abduction, in extension using blue band with quick movements x 15 each; shoulder flexion to 90 palm up and horiz adduction using blue band x 15 quick movements    Other elbow exercises Standing bicep curl in shoulder extension using FM 17# x 10, 20# 2 x 10   3 sec ecc/con     Manual Therapy   Manual Therapy Soft tissue mobilization    Soft tissue mobilization IASTM to distal bicep region                  PT Education - 10/20/19 1218    Education  Details HEP    Person(s) Educated Patient    Methods Explanation;Demonstration;Verbal cues    Comprehension Verbalized understanding;Returned demonstration;Verbal cues required;Need further instruction            PT Short Term Goals - 10/20/19 1257      PT SHORT TERM GOAL #1   Title Patient will be I with initial HEP to progress with PT    Time 4    Period Weeks    Status Achieved    Target Date 10/15/19             PT Long Term Goals - 09/17/19 1319      PT LONG TERM GOAL #1   Title Patient will be I with final HEP to maintain progress from PT    Time 8    Period Weeks    Status New    Target Date 11/12/19      PT LONG TERM GOAL #2   Title Patient will report no limitation or pain with bowling  Time 8    Period Weeks    Status New    Target Date 11/12/19      PT LONG TERM GOAL #3   Title Patient will exhibit 5/5 strength without pain to be able to lift child without limitation    Time 8    Period Weeks    Status New    Target Date 11/12/19      PT LONG TERM GOAL #4   Title Patient will report improved functional level to </= 26% on FOTO    Time 8    Period Weeks    Status New    Target Date 11/12/19                 Plan - 10/20/19 1219    Clinical Impression Statement Patient tolerated therapy well with no adverse effects. He reports improvement but continued discomfort with activities such as lifting his daughter. Progressed bicep strengthening and incorporated quickly movements with good tolerance. He was encouraged to progress load at gym as able. He would benefit from continued skilled PT to improve tissue quality, strength, and tolerance for heavy activity of the bicep so he can return to activities such as bowling without limitation.    PT Treatment/Interventions ADLs/Self Care Home Management;Cryotherapy;Electrical Stimulation;Iontophoresis 4m/ml Dexamethasone;Moist Heat;Therapeutic activities;Therapeutic exercise;Manual  techniques;Patient/family education;Passive range of motion;Dry needling;Taping    PT Next Visit Plan Review HEP and progress PRN, manual to distal bicep, progress strengthening, increase speed of movements    PT Home Exercise Plan [id number] bicep curl in supinated, neutral, and pronated position; shoulder flexion    Consulted and Agree with Plan of Care Patient           Patient will benefit from skilled therapeutic intervention in order to improve the following deficits and impairments:  Decreased strength, Decreased activity tolerance, Pain  Visit Diagnosis: Pain in left elbow  Muscle weakness (generalized)     Problem List There are no problems to display for this patient.   CHilda Blades PT, DPT, LAT, ATC 10/20/19  1:01 PM Phone: [phone]: 3[facility] [address]Phone: [phone]  Fax:  [phone] Name: JKamaal CastMRN: [id number] of Birth: 9Dec 17, 1987   PHYSICAL THERAPY DISCHARGE SUMMARY  Visits from Start of Care: 2  Current functional level related to goals / functional outcomes: See above   Remaining deficits: See above   Education / Equipment: HEP  Plan:                                                    Patient goals were partially met. Patient is being discharged due to not returning since the last visit.  ?????   CHilda Blades PT, DPT, LAT, ATC 12/18/19  2:06 PM Phone: [phone]: [phone]

## 2019-11-11 ENCOUNTER — Ambulatory Visit: Payer: 59 | Attending: Family Medicine | Admitting: Physical Therapy

## 2019-11-12 ENCOUNTER — Telehealth: Payer: Self-pay | Admitting: Physical Therapy

## 2019-11-12 NOTE — Telephone Encounter (Signed)
Patient contacted due to missed PT appointment. He was informed of missed appointment and this was his 2nd no-show. Patient was rescheduled for 12/03/2019 at 11:30am. He was reminded of attendance policy and that he would be discharged from PT if he missed a 3rd appointment without notice. Patient expressed understanding.   Rosana Hoes, PT, DPT, LAT, ATC 11/12/19  1:54 PM Phone: [phone] Fax: [phone]

## 2019-12-03 ENCOUNTER — Ambulatory Visit: Payer: 59 | Admitting: Physical Therapy

## 2019-12-18 ENCOUNTER — Telehealth: Payer: Self-pay | Admitting: Physical Therapy

## 2019-12-18 ENCOUNTER — Ambulatory Visit: Payer: 59 | Attending: Family Medicine | Admitting: Physical Therapy

## 2019-12-18 NOTE — Telephone Encounter (Signed)
Attempted to contact patient due to missed PT appointment. Left VM informing patient of missed appointment, and that he would be discharged from PT due to attendance policy (3 no-shows). Patient instructed to get new PT referral if he would like to continue with therapy.   Rosana Hoes, PT, DPT, LAT, ATC 12/18/19  2:04 PM Phone: [phone] Fax: [phone]

## 2021-01-19 ENCOUNTER — Emergency Department (HOSPITAL_BASED_OUTPATIENT_CLINIC_OR_DEPARTMENT_OTHER): Payer: Self-pay

## 2021-01-19 ENCOUNTER — Emergency Department (HOSPITAL_BASED_OUTPATIENT_CLINIC_OR_DEPARTMENT_OTHER)
Admission: EM | Admit: 2021-01-19 | Discharge: 2021-01-19 | Disposition: A | Discharge status: Home / Self Care | Payer: Self-pay | Attending: Emergency Medicine | Admitting: Emergency Medicine

## 2021-01-19 ENCOUNTER — Other Ambulatory Visit: Payer: Self-pay

## 2021-01-19 ENCOUNTER — Encounter (HOSPITAL_BASED_OUTPATIENT_CLINIC_OR_DEPARTMENT_OTHER): Payer: Self-pay | Admitting: Emergency Medicine

## 2021-01-19 DIAGNOSIS — Z20822 Contact with and (suspected) exposure to covid-19: Secondary | ICD-10-CM | POA: Insufficient documentation

## 2021-01-19 DIAGNOSIS — J209 Acute bronchitis, unspecified: Secondary | ICD-10-CM | POA: Insufficient documentation

## 2021-01-19 LAB — RESP PANEL BY RT-PCR (FLU A&B, COVID) ARPGX2
Influenza A by PCR: NEGATIVE
Influenza B by PCR: NEGATIVE
SARS Coronavirus 2 by RT PCR: NEGATIVE

## 2021-01-19 MED ORDER — AZITHROMYCIN 250 MG PO TABS: 250.0000 mg | ORAL_TABLET | Freq: Every day | ORAL | 0 refills | Status: AC

## 2021-01-19 MED ORDER — GUAIFENESIN-CODEINE 100-10 MG/5ML PO SOLN: 10.0000 mL | Freq: Three times a day (TID) | ORAL | 0 refills | Status: AC | PRN

## 2021-01-19 NOTE — ED Provider Notes (Signed)
[facility] EMERGENCY DEPT Provider Note   CSN: [id number] Arrival date & time: 01/19/21  1034     History  Chief Complaint  Patient presents with   Cough    Austin Foley is a 36 y.o. male.  He is here with nonproductive cough its been going on about 5 weeks.  Austin Foley initially he was sick with fevers and chills along with a cough but not all improved.  Cough remains.  Has tried nothing for it.  Non-smoker.  Feels the irritation is more in his throat and in his chest.  Denies reflux  The history is provided by the patient.  Cough Cough characteristics:  Non-productive Sputum characteristics:  Nondescript Severity:  Moderate Onset quality:  Gradual Duration:  5 weeks Timing:  Intermittent Progression:  Unchanged Chronicity:  New Smoker: no   Context: upper respiratory infection   Relieved by:  None tried Worsened by:  Nothing Ineffective treatments:  None tried Associated symptoms: no chest pain, no chills, no ear pain, no fever, no headaches, no myalgias, no rash, no shortness of breath, no sinus congestion, no sore throat and no wheezing   Risk factors: recent infection       Home Medications Prior to Admission medications   Medication Sig Start Date End Date Taking? Authorizing Provider  albuterol (PROVENTIL HFA;VENTOLIN HFA) 108 (90 BASE) MCG/ACT inhaler Inhale 2 puffs into the lungs every 6 (six) hours as needed for wheezing or shortness of breath. For shortness of breath.    [provider]  clotrimazole-betamethasone (LOTRISONE) cream Apply to affected area 2 times daily prn 10/11/18   Hall-Potvin, [place], PA-C  loratadine (CLARITIN) 10 MG tablet Take 10 mg by mouth daily.  10/11/18  [provider]      Allergies    Amoxicillin and Ceclor [cefaclor]    Review of Systems   Review of Systems  Constitutional:  Negative for chills and fever.  HENT:  Negative for ear pain and sore throat.   Eyes:  Negative for visual disturbance.   Respiratory:  Positive for cough. Negative for shortness of breath and wheezing.   Cardiovascular:  Negative for chest pain.  Gastrointestinal:  Negative for abdominal pain.  Genitourinary:  Negative for dysuria.  Musculoskeletal:  Negative for myalgias.  Skin:  Negative for rash.  Neurological:  Negative for headaches.   Physical Exam Updated Vital Signs BP (!) 145/95 (BP Location: Right Arm)    Pulse 81    Temp 98.2 F (36.8 C) (Oral)    Resp 18    Ht 5\' 10"  (1.778 m)    Wt 133.4 kg    SpO2 99%    BMI 42.18 kg/m  Physical Exam Vitals and nursing note reviewed.  Constitutional:      General: He is not in acute distress.    Appearance: Normal appearance. He is well-developed.  HENT:     Head: Normocephalic and atraumatic.  Eyes:     Conjunctiva/sclera: Conjunctivae normal.  Cardiovascular:     Rate and Rhythm: Normal rate and regular rhythm.     Heart sounds: No murmur heard. Pulmonary:     Effort: Pulmonary effort is normal. No respiratory distress.     Breath sounds: Normal breath sounds.  Abdominal:     Palpations: Abdomen is soft.     Tenderness: There is no abdominal tenderness. There is no guarding or rebound.  Musculoskeletal:        General: No swelling.     Cervical back: Neck supple.  Right lower leg: No edema.     Left lower leg: No edema.  Skin:    General: Skin is warm and dry.     Capillary Refill: Capillary refill takes less than 2 seconds.  Neurological:     General: No focal deficit present.     Mental Status: He is alert.     Gait: Gait normal.    ED Results / Procedures / Treatments   Labs (all labs ordered are listed, but only abnormal results are displayed) Labs Reviewed  RESP PANEL BY RT-PCR (FLU A&B, COVID) ARPGX2    EKG None  Radiology DG Chest Port 1 View  Result Date: 01/19/2021 CLINICAL DATA:  cough EXAM: PORTABLE CHEST 1 VIEW COMPARISON:  November 2019 FINDINGS: The cardiomediastinal silhouette is within normal limits. No  pleural effusion. No pneumothorax. No mass or consolidation. No acute osseous abnormality. IMPRESSION: No acute findings in the chest. Electronically Signed   By: Albin Felling M.D.   On: 01/19/2021 11:17    Procedures Procedures    Medications Ordered in ED Medications - No data to display  ED Course/ Medical Decision Making/ A&P Clinical Course as of 01/19/21 2050  Thu Jan 19, 2021  1119 Chest x-ray does not show any acute infiltrates.  Awaiting radiology reading. [MB]    Clinical Course User Index [MB] Hayden Rasmussen, MD                           Medical Decision Making Amount and/or Complexity of Data Reviewed Radiology: ordered.  Risk OTC drugs. Prescription drug management.  Austin Foley was evaluated in Emergency Department on 01/19/2021 for the symptoms described in the history of present illness. He was evaluated in the context of the global COVID-19 pandemic, which necessitated consideration that the patient might be at risk for infection with the SARS-CoV-2 virus that causes COVID-19. Institutional protocols and algorithms that pertain to the evaluation of patients at risk for COVID-19 are in a state of rapid change based on information released by regulatory bodies including the CDC and federal and state organizations. These policies and algorithms were followed during the patient's care in the ED.          Final Clinical Impression(s) / ED Diagnoses Final diagnoses:  Acute bronchitis, unspecified organism    Rx / DC Orders ED Discharge Orders          Ordered    azithromycin (ZITHROMAX) 250 MG tablet  Daily        01/19/21 1123    guaiFENesin-codeine 100-10 MG/5ML syrup  3 times daily PRN        01/19/21 1123              Hayden Rasmussen, MD 01/19/21 2050

## 2021-01-19 NOTE — ED Triage Notes (Signed)
Reports cough since the week before christmas.  Had other covid symptoms then but now just dealing with the cough.  Is not taking anything at this time.

## 2021-01-19 NOTE — Discharge Instructions (Addendum)
You were seen in the emergency department for cough.  Your chest x-ray did not show any signs of pneumonia.  Your COVID and flu tests are pending at time of discharge and you can follow these up in [place].  We are prescribing you an antibiotic and some codeine-based cough syrup.  If you continue to cough after this treatment may be related to acid reflux.  Return to the emergency department if any worsening or concerning symptoms

## 2021-06-13 ENCOUNTER — Encounter (HOSPITAL_BASED_OUTPATIENT_CLINIC_OR_DEPARTMENT_OTHER): Payer: Self-pay

## 2021-06-13 ENCOUNTER — Other Ambulatory Visit: Payer: Self-pay

## 2021-06-13 DIAGNOSIS — Z20822 Contact with and (suspected) exposure to covid-19: Secondary | ICD-10-CM | POA: Insufficient documentation

## 2021-06-13 DIAGNOSIS — J018 Other acute sinusitis: Secondary | ICD-10-CM | POA: Insufficient documentation

## 2021-06-13 LAB — RESP PANEL BY RT-PCR (FLU A&B, COVID) ARPGX2
Influenza A by PCR: NEGATIVE
Influenza B by PCR: NEGATIVE
SARS Coronavirus 2 by RT PCR: NEGATIVE

## 2021-06-13 NOTE — ED Triage Notes (Signed)
Patient here POV from Home.  Endorses having a headache for approximately 1.5 Weeks. Constant in Opp but fluctuates in Severity.   No Confirmed History of HTN. No Medications for Same. BP of 137/100 at Home.   No Vision Changes. No SOB. No Other Pain.   NAD Noted during Triage. A&Ox4. GCS 15. Ambulatory.

## 2021-06-14 ENCOUNTER — Emergency Department (HOSPITAL_BASED_OUTPATIENT_CLINIC_OR_DEPARTMENT_OTHER)
Admission: EM | Admit: 2021-06-14 | Discharge: 2021-06-14 | Disposition: A | Discharge status: Home / Self Care | Payer: Self-pay | Attending: Emergency Medicine | Admitting: Emergency Medicine

## 2021-06-14 DIAGNOSIS — B9789 Other viral agents as the cause of diseases classified elsewhere: Secondary | ICD-10-CM

## 2021-06-14 MED ORDER — ONDANSETRON 4 MG PO TBDP: ORAL_TABLET | ORAL | 0 refills | Status: AC

## 2021-06-14 MED ORDER — BENZONATATE 100 MG PO CAPS: 100.0000 mg | ORAL_CAPSULE | Freq: Three times a day (TID) | ORAL | 0 refills | Status: DC

## 2021-06-14 NOTE — ED Notes (Signed)
Pt verbalizes understanding of discharge instructions. Opportunity for questioning and answers were provided. Pt discharged from ED to home.   ? ?

## 2021-06-14 NOTE — Discharge Instructions (Signed)
Your symptoms are most consistent with a viral syndrome.  Please follow-up with the family doctor in the office.  If you not have 1 there is a hotline number on this paperwork or you could try and call the primary care office in this building.  Please return for worsening headache or if you develop a fever.  Try to have your blood pressure rechecked by your family doctor when you are not sick.  Try to avoid decongestants as they can make your blood pressure go up.

## 2021-06-14 NOTE — ED Provider Notes (Signed)
[facility] EMERGENCY DEPT Provider Note   CSN: [id number] Arrival date & time: 06/13/21  2203     History  Chief Complaint  Patient presents with   Headache    Austin Foley is a 36 y.o. male.  36 yo M with a chief complaints of headache.  He does but this is very mild is been going on for couple days.  He is a bit congested but thinks it is due to his allergies.  He was concerned because he checked his blood pressure and it was bit up today.  Denies any trauma to the head of the neck.   Headache      Home Medications Prior to Admission medications   Medication Sig Start Date End Date Taking? Authorizing Provider  benzonatate (TESSALON) 100 MG capsule Take 1 capsule (100 mg total) by mouth every 8 (eight) hours. 06/14/21  Yes Melene Plan, DO  ondansetron (ZOFRAN-ODT) 4 MG disintegrating tablet 4mg  ODT q4 hours prn nausea/vomit 06/14/21  Yes 06/16/21, DO  albuterol (PROVENTIL HFA;VENTOLIN HFA) 108 (90 BASE) MCG/ACT inhaler Inhale 2 puffs into the lungs every 6 (six) hours as needed for wheezing or shortness of breath. For shortness of breath.    [provider]  azithromycin (ZITHROMAX) 250 MG tablet Take 1 tablet (250 mg total) by mouth daily. Take first 2 tablets together, then 1 every day until finished. 01/19/21   01/21/21, MD  clotrimazole-betamethasone (LOTRISONE) cream Apply to affected area 2 times daily prn 10/11/18   Hall-Potvin, 12/11/18, PA-C  guaiFENesin-codeine 100-10 MG/5ML syrup Take 10 mLs by mouth 3 (three) times daily as needed for cough. 01/19/21   01/21/21, MD  loratadine (CLARITIN) 10 MG tablet Take 10 mg by mouth daily.  10/11/18  [provider]      Allergies    Amoxicillin and Ceclor [cefaclor]    Review of Systems   Review of Systems  Neurological:  Positive for headaches.    Physical Exam Updated Vital Signs BP (!) 135/99   Pulse 86   Temp 98.1 F (36.7 C)   Resp 17   Ht 5\' 10"  (1.778 m)    Wt 133.4 kg   SpO2 99%   BMI 42.20 kg/m  Physical Exam Vitals and nursing note reviewed.  Constitutional:      Appearance: He is well-developed.  HENT:     Head: Normocephalic and atraumatic.     Comments: Swollen turbinates, posterior nasal drip, no noted sinus ttp, tm normal bilaterally.   Eyes:     Pupils: Pupils are equal, round, and reactive to light.  Neck:     Vascular: No JVD.  Cardiovascular:     Rate and Rhythm: Normal rate and regular rhythm.     Heart sounds: No murmur heard.    No friction rub. No gallop.  Pulmonary:     Effort: No respiratory distress.     Breath sounds: No wheezing.  Abdominal:     General: There is no distension.     Tenderness: There is no abdominal tenderness. There is no guarding or rebound.  Musculoskeletal:        General: Normal range of motion.     Cervical back: Normal range of motion and neck supple.  Skin:    Coloration: Skin is not pale.     Findings: No rash.  Neurological:     Mental Status: He is alert and oriented to person, place, and time.  Psychiatric:  Behavior: Behavior normal.     ED Results / Procedures / Treatments   Labs (all labs ordered are listed, but only abnormal results are displayed) Labs Reviewed  RESP PANEL BY RT-PCR (FLU A&B, COVID) ARPGX2    EKG EKG Interpretation  Date/Time:  Tuesday June 13 2021 22:29:14 EDT Ventricular Rate:  89 PR Interval:  168 QRS Duration: 104 QT Interval:  356 QTC Calculation: 433 R Axis:   65 Text Interpretation: Normal sinus rhythm Cannot rule out Inferior infarct , age undetermined Cannot rule out Anterior infarct , age undetermined Abnormal ECG flipped t wave in lead III and v3, seen on prior though not most recent Confirmed by Melene Plan [phone]) on 06/13/2021 11:04:11 PM  Radiology No results found.  Procedures Procedures    Medications Ordered in ED Medications - No data to display  ED Course/ Medical Decision Making/ A&P                            Medical Decision Making Risk Prescription drug management.   36 yo M with a chief complaint of a headache.  On physical exam is most consistent with a viral syndrome he is swollen turbinates posterior nasal drip and some significant congestion.  No significant pain on percussion of the sinuses.  Most likely viral sinusitis.  We will have him follow-up with his family doctor in the office.  2:26 AM:  I have discussed the diagnosis/risks/treatment options with the patient.  Evaluation and diagnostic testing in the emergency department does not suggest an emergent condition requiring admission or immediate intervention beyond what has been performed at this time.  They will follow up with  PCP. We also discussed returning to the ED immediately if new or worsening sx occur. We discussed the sx which are most concerning (e.g., sudden worsening pain, fever, inability to tolerate by mouth) that necessitate immediate return. Medications administered to the patient during their visit and any new prescriptions provided to the patient are listed below.  Medications given during this visit Medications - No data to display   The patient appears reasonably screen and/or stabilized for discharge and I doubt any other medical condition or other [facility] requiring further screening, evaluation, or treatment in the ED at this time prior to discharge.          Final Clinical Impression(s) / ED Diagnoses Final diagnoses:  Viral sinusitis    Rx / DC Orders ED Discharge Orders          Ordered    ondansetron (ZOFRAN-ODT) 4 MG disintegrating tablet        06/14/21 0050    benzonatate (TESSALON) 100 MG capsule  Every 8 hours        06/14/21 0050              Melene Plan, DO 06/14/21 0226

## 2022-01-25 IMAGING — DX DG CHEST 1V PORT
1 series · 1 of 1 positions shown · non-contrast
Comparison: November 2017

CLINICAL DATA: cough

EXAM:
PORTABLE CHEST 1 VIEW

[chest]
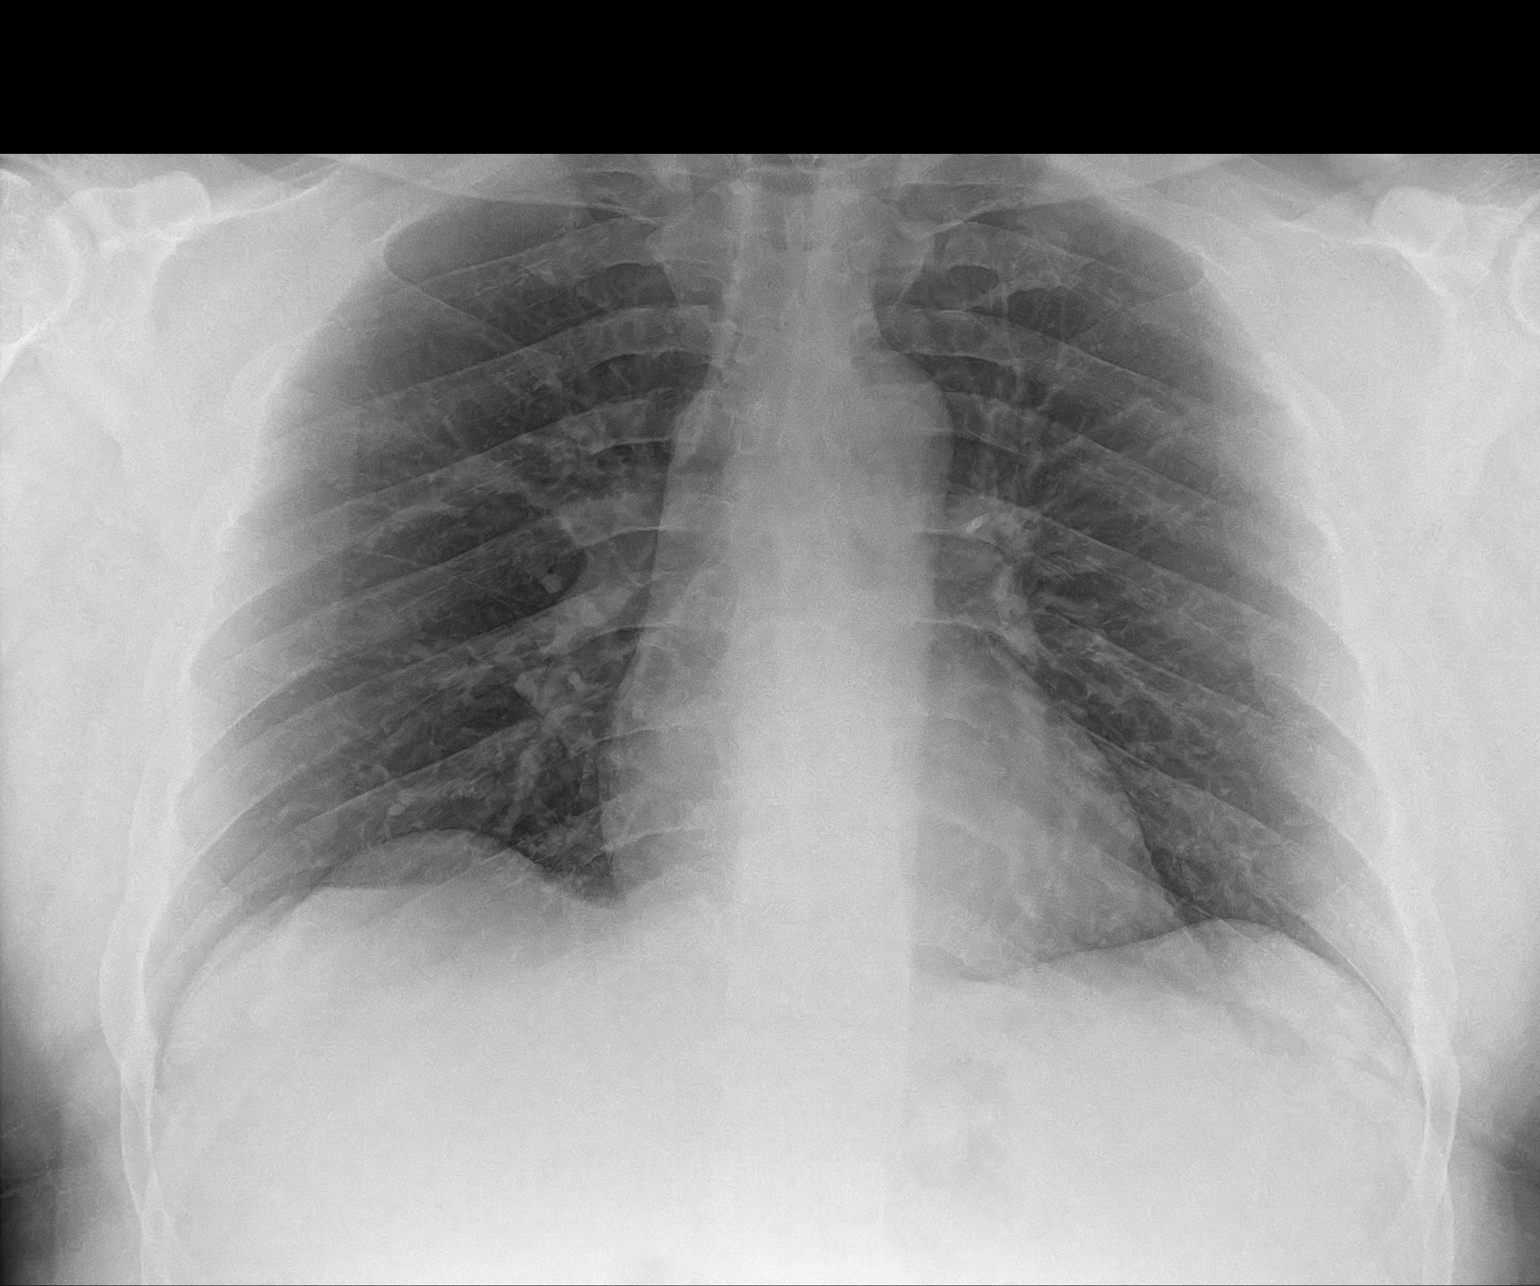

[1 of 1 positions shown; findings below may reference images not displayed]

FINDINGS: The cardiomediastinal silhouette is within normal limits. No pleural
effusion. No pneumothorax. No mass or consolidation. No acute
osseous abnormality.
IMPRESSION: No acute findings in the chest.

## 2022-06-18 ENCOUNTER — Emergency Department (HOSPITAL_BASED_OUTPATIENT_CLINIC_OR_DEPARTMENT_OTHER): Payer: Self-pay | Admitting: Radiology

## 2022-06-18 ENCOUNTER — Emergency Department (HOSPITAL_BASED_OUTPATIENT_CLINIC_OR_DEPARTMENT_OTHER)
Admission: EM | Admit: 2022-06-18 | Discharge: 2022-06-18 | Disposition: A | Discharge status: Home / Self Care | Payer: Self-pay | Attending: Emergency Medicine | Admitting: Emergency Medicine

## 2022-06-18 ENCOUNTER — Other Ambulatory Visit: Payer: Self-pay

## 2022-06-18 ENCOUNTER — Encounter (HOSPITAL_BASED_OUTPATIENT_CLINIC_OR_DEPARTMENT_OTHER): Payer: Self-pay | Admitting: Emergency Medicine

## 2022-06-18 DIAGNOSIS — J45909 Unspecified asthma, uncomplicated: Secondary | ICD-10-CM | POA: Insufficient documentation

## 2022-06-18 DIAGNOSIS — R051 Acute cough: Secondary | ICD-10-CM | POA: Insufficient documentation

## 2022-06-18 MED ORDER — BENZONATATE 100 MG PO CAPS: 100.0000 mg | ORAL_CAPSULE | Freq: Three times a day (TID) | ORAL | 0 refills | Status: AC | PRN

## 2022-06-18 MED ORDER — PREDNISONE 20 MG PO TABS: 40.0000 mg | ORAL_TABLET | Freq: Every day | ORAL | 0 refills | Status: AC

## 2022-06-18 MED ORDER — FLUTICASONE PROPIONATE 50 MCG/ACT NA SUSP: 2.0000 | Freq: Every day | NASAL | 0 refills | Status: AC

## 2022-06-18 MED ORDER — ALBUTEROL SULFATE HFA 108 (90 BASE) MCG/ACT IN AERS: 1.0000 | INHALATION_SPRAY | Freq: Four times a day (QID) | RESPIRATORY_TRACT | 0 refills | Status: AC | PRN

## 2022-06-18 NOTE — Discharge Instructions (Signed)
You were evaluated in the ED today with cough. Sometimes it takes many weeks to completely go away, especially if you smoke or have chronic lung problems.  Please take any medications prescribed and follow up as recommended with your regular doctor.  If you develop any new or worsening symptoms, including but not limited to fever, persistent vomiting, worsening shortness of breath, or other symptoms that concern you, please return to the Emergency Department immediately.

## 2022-06-18 NOTE — ED Provider Notes (Signed)
Emergency Department Provider Note   I have reviewed the triage vital signs and the nursing notes.   HISTORY  Chief Complaint Cough   HPI Austin Foley is a 37 y.o. male with remote history of asthma presents to the emergency department for evaluation of cough and nasal congestion.  Symptoms been present for the past 2.5 weeks.  He states mainly he is having issues with persistent runny nose and feels like this is leading to his cough.  No fevers.  No headache or face pain.  He is not feeling particularly short of breath.   Past Medical History:  Diagnosis Date   ADD (attention deficit disorder)    Asthma     Review of Systems  Constitutional: No fever/chills ENT: No sore throat. Positive copious nasal congestion.  Cardiovascular: Denies chest pain. Respiratory: Denies shortness of breath. Positive persistent cough.  Gastrointestinal: No abdominal pain.  Musculoskeletal: Negative for back pain. Skin: Negative for rash. Neurological: Negative for headaches.   ____________________________________________   PHYSICAL EXAM:  VITAL SIGNS: ED Triage Vitals  Enc Vitals Group     BP 06/18/22 0718 126/81     Pulse Rate 06/18/22 0718 76     Resp 06/18/22 0718 14     Temp 06/18/22 0718 98.3 F (36.8 C)     Temp Source 06/18/22 0718 Oral     SpO2 06/18/22 0718 100 %     Weight 06/18/22 0718 285 lb (129.3 kg)     Height 06/18/22 0718 5\' 10"  (1.778 m)   Constitutional: Alert and oriented. Well appearing and in no acute distress. Eyes: Conjunctivae are slightly injected.  Head: Atraumatic. Nose: No congestion/rhinnorhea. Mouth/Throat: Mucous membranes are moist. Neck: No stridor.   Cardiovascular: Normal rate, regular rhythm. Good peripheral circulation. Grossly normal heart sounds.   Respiratory: Normal respiratory effort.  No retractions. Lungs CTAB. Gastrointestinal: No distention.  Musculoskeletal: No gross deformities of extremities. Neurologic:  Normal speech  and language.  Skin:  Skin is warm, dry and intact. No rash noted.  ____________________________________________  RADIOLOGY  DG Chest 2 View  Result Date: 06/18/2022 CLINICAL DATA:  Cough EXAM: CHEST - 2 VIEW COMPARISON:  01/19/2021 FINDINGS: The heart size and mediastinal contours are within normal limits. Both lungs are clear. The visualized skeletal structures are unremarkable. IMPRESSION: No active cardiopulmonary disease. Electronically Signed   By: Ernie Avena M.D.   On: 06/18/2022 08:03    ____________________________________________   PROCEDURES  Procedure(s) performed:   Procedures  None  ____________________________________________   INITIAL IMPRESSION / ASSESSMENT AND PLAN / ED COURSE  Pertinent labs & imaging results that were available during my care of the patient were reviewed by me and considered in my medical decision making (see chart for details).   This patient is Presenting for Evaluation of cough, which does require a range of treatment options, and is a complaint that involves a moderate risk of morbidity and mortality.  The Differential Diagnoses include allergic rhinitis, bronchitis, cough-variant asthma, CAP, etc.  Radiologic Tests Ordered, included CXR. I independently interpreted the images and agree with radiology interpretation.   Medical Decision Making: Summary:  Patient presents emergency department with persistent cough over the past 2.5 weeks.  Remote history of asthma but did not appreciate any significant wheezing on my exam.  With coughing, patient does have a bronchospastic component to his cough.  Plan for x-ray given prolonged cough.  Not a good candidate for viral testing as he does not have significant viral symptoms such  as fever and with 2.5 weeks of symptoms would not be a candidate for antiviral therapy. Plan for steroid burst, MDI, cough meds, and Flonase. Will follow CXR to decide further on abx.   Reevaluation with update  and discussion with patient.  Symptoms are improved.  Will send medications to the pharmacy.  Chest x-ray without pneumonia.   Patient's presentation is most consistent with acute, uncomplicated illness.   Disposition: discharge  ____________________________________________  FINAL CLINICAL IMPRESSION(S) / ED DIAGNOSES  Final diagnoses:  Acute cough     NEW OUTPATIENT MEDICATIONS STARTED DURING THIS VISIT:  New Prescriptions   ALBUTEROL (VENTOLIN HFA) 108 (90 BASE) MCG/ACT INHALER    Inhale 1-2 puffs into the lungs every 6 (six) hours as needed for wheezing or shortness of breath.   BENZONATATE (TESSALON) 100 MG CAPSULE    Take 1 capsule (100 mg total) by mouth 3 (three) times daily as needed for cough.   FLUTICASONE (FLONASE) 50 MCG/ACT NASAL SPRAY    Place 2 sprays into both nostrils daily for 7 days.   PREDNISONE (DELTASONE) 20 MG TABLET    Take 2 tablets (40 mg total) by mouth daily for 5 days.    Note:  This document was prepared using Dragon voice recognition software and may include unintentional dictation errors.  Alona Bene, MD, [facility]    Evon Dejarnett, Arlyss Repress, MD 06/18/22 [phone]

## 2022-06-18 NOTE — ED Triage Notes (Signed)
Pt arrives to ED with c/o cough and congestion x2.5 weeks.

## 2022-06-18 NOTE — ED Notes (Signed)
Discharge instructions, follow up care, and prescriptions reviewed and explained, pt verbalized understanding and had no further questions on d/c. Pt caox4, ambulatory, NAD on d/c.  

## 2023-02-15 ENCOUNTER — Other Ambulatory Visit: Payer: Self-pay

## 2023-02-15 ENCOUNTER — Emergency Department (HOSPITAL_BASED_OUTPATIENT_CLINIC_OR_DEPARTMENT_OTHER)
Admission: EM | Admit: 2023-02-15 | Discharge: 2023-02-15 | Discharge status: Against Medical Advice | Payer: Medicaid Other | Attending: Emergency Medicine | Admitting: Emergency Medicine

## 2023-02-15 DIAGNOSIS — Z5321 Procedure and treatment not carried out due to patient leaving prior to being seen by health care provider: Secondary | ICD-10-CM | POA: Insufficient documentation

## 2023-02-15 DIAGNOSIS — R197 Diarrhea, unspecified: Secondary | ICD-10-CM | POA: Diagnosis not present

## 2023-02-15 DIAGNOSIS — R059 Cough, unspecified: Secondary | ICD-10-CM | POA: Insufficient documentation

## 2023-02-15 LAB — RESP PANEL BY RT-PCR (RSV, FLU A&B, COVID)  RVPGX2
Influenza A by PCR: NEGATIVE
Influenza B by PCR: NEGATIVE
Resp Syncytial Virus by PCR: NEGATIVE
SARS Coronavirus 2 by RT PCR: NEGATIVE

## 2023-02-15 NOTE — ED Triage Notes (Signed)
Pt c/o non-productive cough and diarrhea since yesterday. Pt reports his daughter currently has the flu. Denies fever.
